# Patient Record
Sex: Female | Born: 1980 | Race: Black or African American | Hispanic: No | Marital: Single | Smoking: Current every day smoker
Health system: Southern US, Community
[De-identification: ages and names within clinical notes are randomized; demographics above are authoritative.]

## PROBLEM LIST (undated history)

## (undated) DIAGNOSIS — F32A Depression, unspecified: Secondary | ICD-10-CM

## (undated) DIAGNOSIS — I10 Essential (primary) hypertension: Secondary | ICD-10-CM

## (undated) DIAGNOSIS — F329 Major depressive disorder, single episode, unspecified: Secondary | ICD-10-CM

## (undated) HISTORY — PX: WISDOM TOOTH EXTRACTION: SHX21

## (undated) HISTORY — DX: Essential (primary) hypertension: I10

## (undated) HISTORY — DX: Major depressive disorder, single episode, unspecified: F32.9

## (undated) HISTORY — PX: DIAGNOSTIC LAPAROSCOPY: SUR761

## (undated) HISTORY — DX: Depression, unspecified: F32.A

---

## 2006-12-12 ENCOUNTER — Ambulatory Visit (HOSPITAL_COMMUNITY): Admission: RE | Admit: 2006-12-12 | Discharge: 2006-12-12 | Payer: Self-pay | Admitting: Family Medicine

## 2007-04-11 ENCOUNTER — Ambulatory Visit: Payer: Self-pay | Admitting: Family Medicine

## 2007-04-25 ENCOUNTER — Ambulatory Visit (HOSPITAL_COMMUNITY): Admission: RE | Admit: 2007-04-25 | Discharge: 2007-04-25 | Payer: Self-pay | Admitting: Family Medicine

## 2007-04-25 ENCOUNTER — Ambulatory Visit: Payer: Self-pay | Admitting: Family Medicine

## 2007-05-08 ENCOUNTER — Ambulatory Visit: Payer: Self-pay | Admitting: Obstetrics and Gynecology

## 2007-06-02 ENCOUNTER — Ambulatory Visit: Payer: Self-pay | Admitting: Obstetrics and Gynecology

## 2007-06-02 ENCOUNTER — Inpatient Hospital Stay (HOSPITAL_COMMUNITY): Admission: AD | Admit: 2007-06-02 | Discharge: 2007-06-02 | Payer: Self-pay | Admitting: Obstetrics and Gynecology

## 2008-09-23 ENCOUNTER — Emergency Department (HOSPITAL_COMMUNITY): Admission: EM | Admit: 2008-09-23 | Discharge: 2008-09-23 | Payer: Self-pay | Admitting: Family Medicine

## 2008-12-23 ENCOUNTER — Emergency Department (HOSPITAL_COMMUNITY): Admission: EM | Admit: 2008-12-23 | Discharge: 2008-12-23 | Payer: Self-pay | Admitting: Emergency Medicine

## 2008-12-27 ENCOUNTER — Emergency Department (HOSPITAL_COMMUNITY): Admission: EM | Admit: 2008-12-27 | Discharge: 2008-12-27 | Payer: Self-pay | Admitting: Emergency Medicine

## 2009-01-05 ENCOUNTER — Encounter: Admission: RE | Admit: 2009-01-05 | Discharge: 2009-04-05 | Payer: Self-pay | Admitting: Sports Medicine

## 2009-07-14 ENCOUNTER — Ambulatory Visit: Payer: Self-pay | Admitting: Obstetrics and Gynecology

## 2009-08-26 ENCOUNTER — Other Ambulatory Visit: Admission: RE | Admit: 2009-08-26 | Discharge: 2009-08-26 | Payer: Self-pay | Admitting: Obstetrics and Gynecology

## 2009-08-26 ENCOUNTER — Ambulatory Visit: Payer: Self-pay | Admitting: Obstetrics & Gynecology

## 2009-08-26 LAB — CONVERTED CEMR LAB
Prolactin: 7 ng/mL
TSH: 1.528 microintl units/mL (ref 0.350–4.500)

## 2009-09-06 ENCOUNTER — Encounter: Payer: Self-pay | Admitting: Obstetrics & Gynecology

## 2009-09-06 LAB — CONVERTED CEMR LAB: TSH: 1.526 microintl units/mL (ref 0.350–4.500)

## 2009-09-09 ENCOUNTER — Ambulatory Visit: Payer: Self-pay | Admitting: Obstetrics & Gynecology

## 2009-09-20 ENCOUNTER — Ambulatory Visit: Payer: Self-pay | Admitting: Family Medicine

## 2009-09-20 DIAGNOSIS — E282 Polycystic ovarian syndrome: Secondary | ICD-10-CM | POA: Insufficient documentation

## 2009-09-20 DIAGNOSIS — E669 Obesity, unspecified: Secondary | ICD-10-CM | POA: Insufficient documentation

## 2009-09-20 DIAGNOSIS — R7309 Other abnormal glucose: Secondary | ICD-10-CM

## 2009-09-20 DIAGNOSIS — I10 Essential (primary) hypertension: Secondary | ICD-10-CM | POA: Insufficient documentation

## 2009-10-08 ENCOUNTER — Emergency Department (HOSPITAL_COMMUNITY): Admission: EM | Admit: 2009-10-08 | Discharge: 2009-10-08 | Payer: Self-pay | Admitting: Emergency Medicine

## 2009-10-20 ENCOUNTER — Ambulatory Visit: Payer: Self-pay | Admitting: Family Medicine

## 2009-11-14 ENCOUNTER — Telehealth: Payer: Self-pay | Admitting: *Deleted

## 2009-11-28 ENCOUNTER — Telehealth: Payer: Self-pay | Admitting: Family Medicine

## 2009-12-12 ENCOUNTER — Telehealth: Payer: Self-pay | Admitting: Family Medicine

## 2009-12-12 ENCOUNTER — Ambulatory Visit (HOSPITAL_COMMUNITY): Admission: RE | Admit: 2009-12-12 | Discharge: 2009-12-12 | Payer: Self-pay | Admitting: Family Medicine

## 2010-01-08 ENCOUNTER — Emergency Department (HOSPITAL_COMMUNITY): Admission: EM | Admit: 2010-01-08 | Discharge: 2010-01-08 | Payer: Self-pay | Admitting: Family Medicine

## 2010-01-12 ENCOUNTER — Ambulatory Visit: Payer: Self-pay | Admitting: Family Medicine

## 2010-01-12 DIAGNOSIS — R109 Unspecified abdominal pain: Secondary | ICD-10-CM

## 2010-01-16 ENCOUNTER — Ambulatory Visit (HOSPITAL_COMMUNITY): Admission: RE | Admit: 2010-01-16 | Discharge: 2010-01-16 | Payer: Self-pay | Admitting: Family Medicine

## 2010-01-16 ENCOUNTER — Encounter: Payer: Self-pay | Admitting: Family Medicine

## 2010-02-07 ENCOUNTER — Ambulatory Visit: Payer: Self-pay | Admitting: Family Medicine

## 2010-04-12 ENCOUNTER — Inpatient Hospital Stay (HOSPITAL_COMMUNITY): Admission: AD | Admit: 2010-04-12 | Discharge: 2010-04-12 | Payer: Self-pay | Admitting: Obstetrics & Gynecology

## 2010-04-19 ENCOUNTER — Ambulatory Visit: Payer: Self-pay | Admitting: Obstetrics & Gynecology

## 2010-05-02 ENCOUNTER — Ambulatory Visit: Payer: Self-pay | Admitting: Family Medicine

## 2010-05-02 DIAGNOSIS — F339 Major depressive disorder, recurrent, unspecified: Secondary | ICD-10-CM | POA: Insufficient documentation

## 2010-05-17 ENCOUNTER — Telehealth: Payer: Self-pay | Admitting: *Deleted

## 2010-06-08 ENCOUNTER — Ambulatory Visit: Payer: Self-pay | Admitting: Family Medicine

## 2010-06-08 DIAGNOSIS — G4700 Insomnia, unspecified: Secondary | ICD-10-CM

## 2010-06-19 ENCOUNTER — Ambulatory Visit: Payer: Self-pay | Admitting: Family Medicine

## 2010-06-20 ENCOUNTER — Telehealth: Payer: Self-pay | Admitting: Psychology

## 2010-06-30 ENCOUNTER — Ambulatory Visit: Payer: Self-pay | Admitting: Family Medicine

## 2010-06-30 DIAGNOSIS — K029 Dental caries, unspecified: Secondary | ICD-10-CM | POA: Insufficient documentation

## 2010-07-05 IMAGING — US US TRANSVAGINAL NON-OB
1 series · 14 of 25 positions shown · non-contrast
Comparison: 10/08/2009

CLINICAL DATA: Follow-up left ovarian cyst

TRANSVAGINAL ULTRASOUND OF PELVIS
TECHNIQUE: Transvaginal ultrasound examination of the pelvis was
performed including evaluation of the uterus, ovaries, adnexal
regions, and pelvic cul-de-sac.

[Series 1: us transvaginal non-ob · 0.13mm/px · 33 acquisitions, 14 frames shown]
[im 1/33]
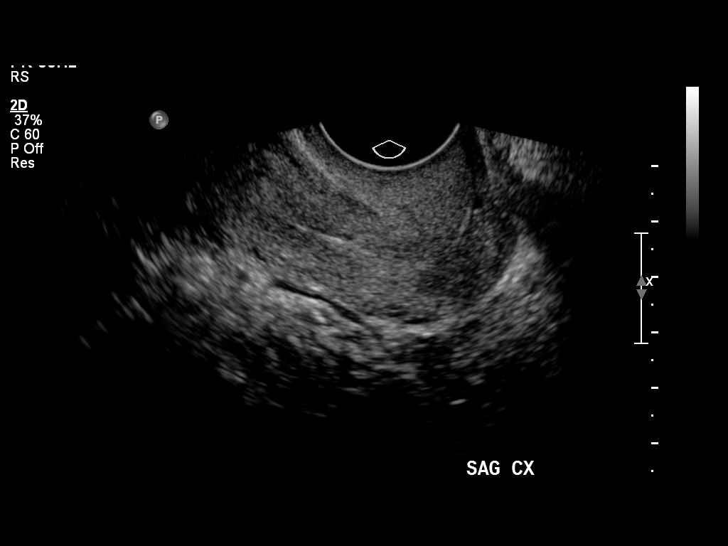
[im 3/33]
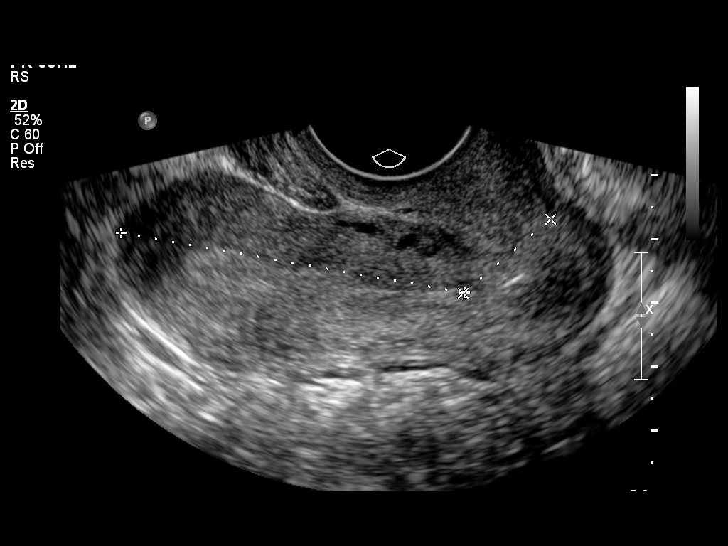
[im 6/33]
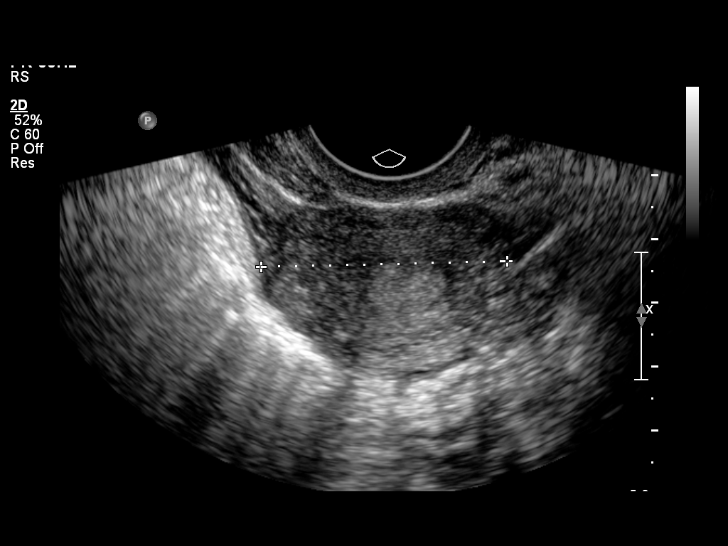
[im 9/33]
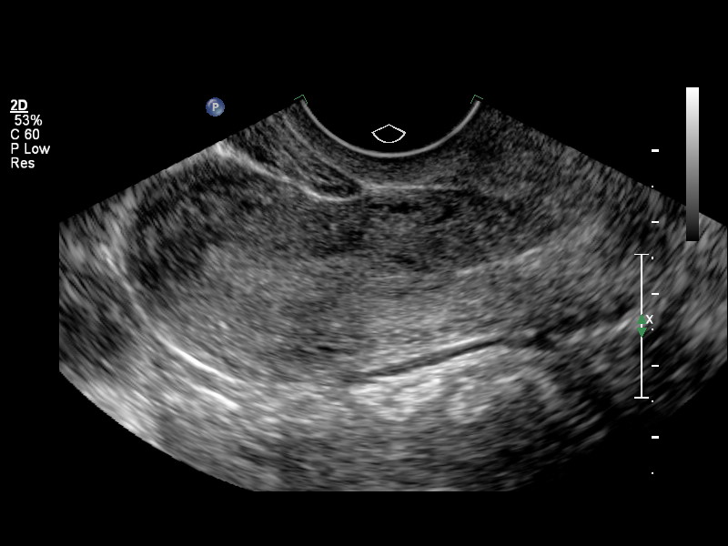
[im 11/33]
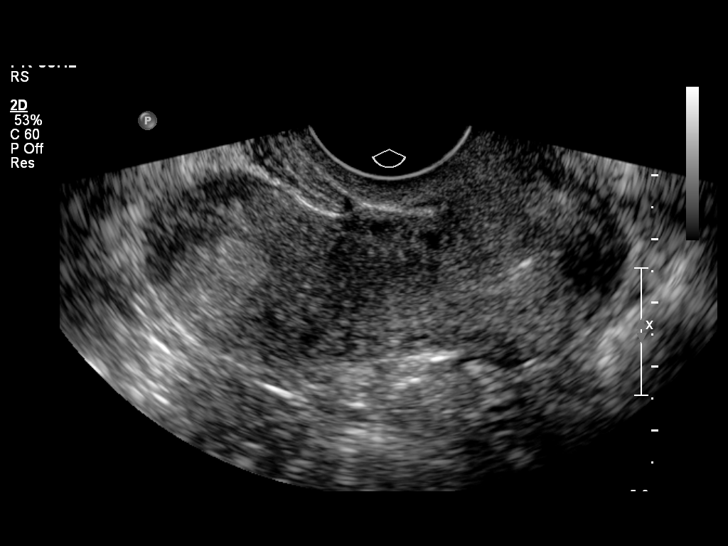
[im 13/33]
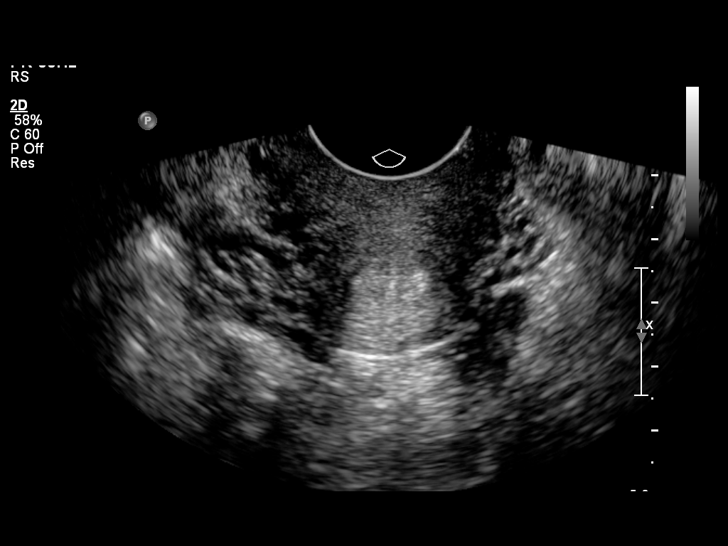
[im 15/33]
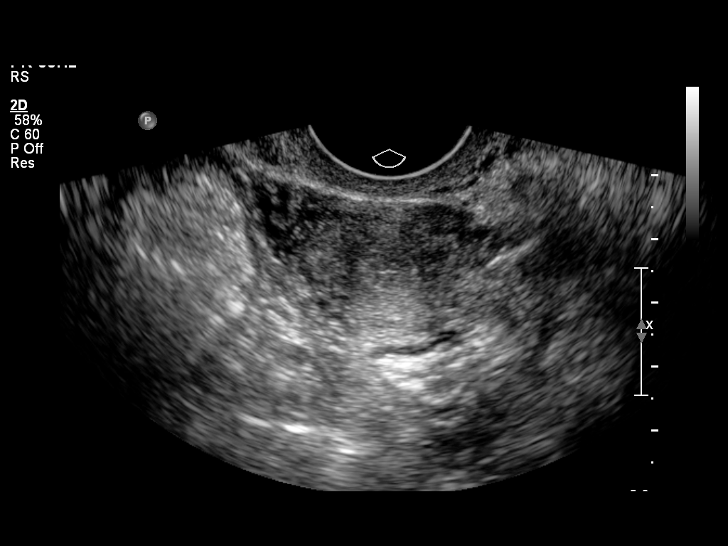
[im 18/33]
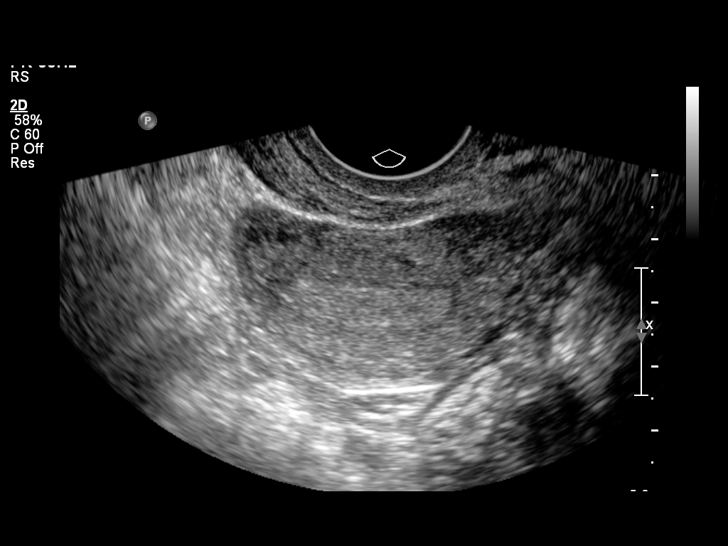
[im 21/33]
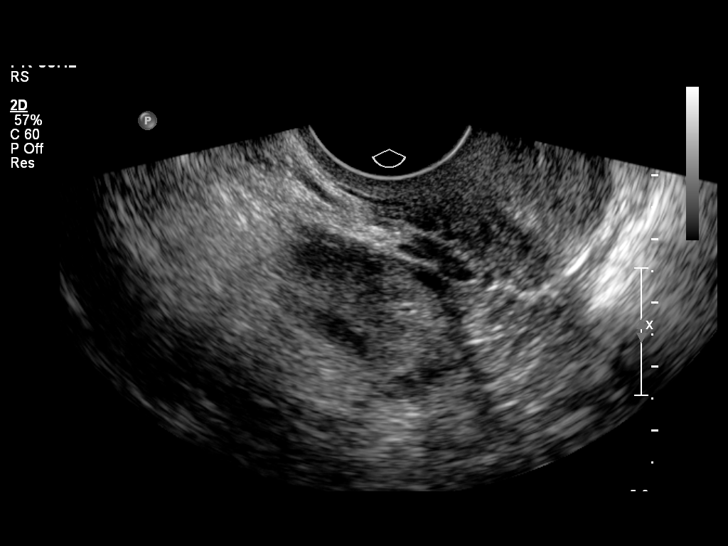
[im 22/33]
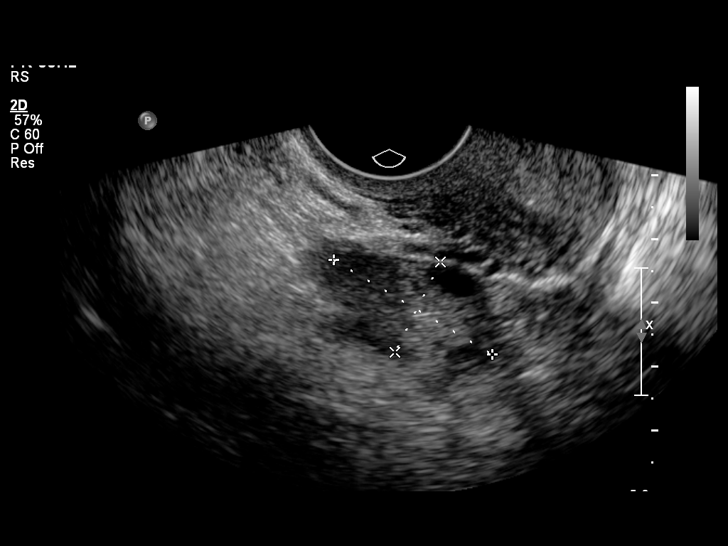
[im 25/33]
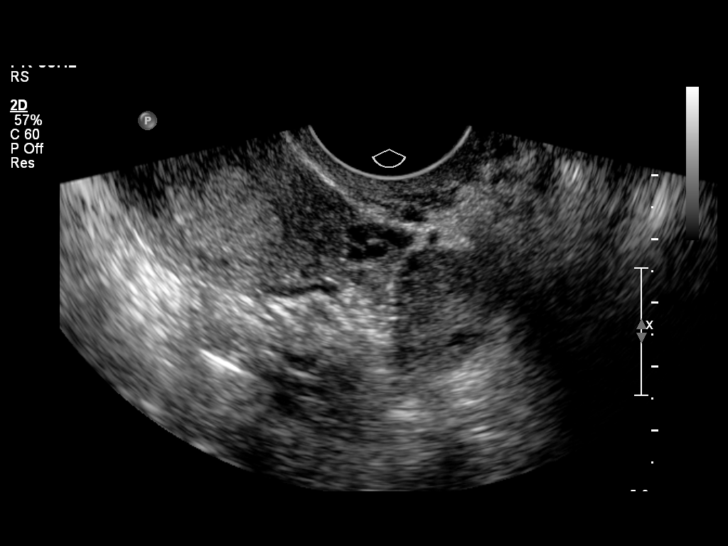
[im 27/33]
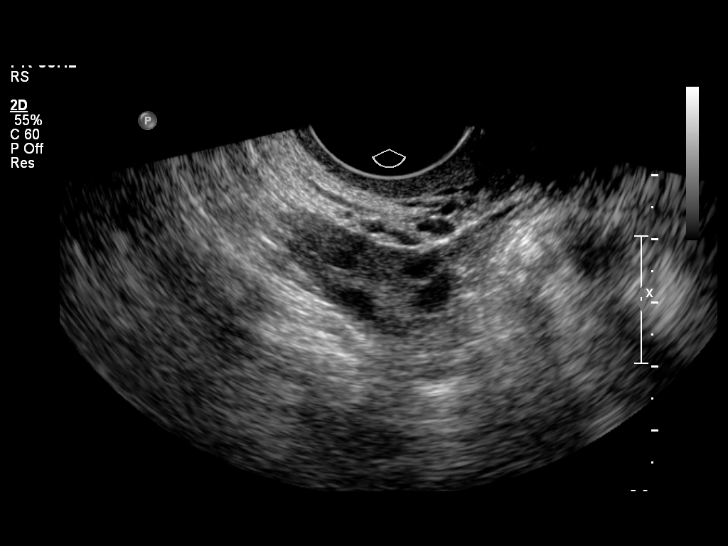
[im 30/33]
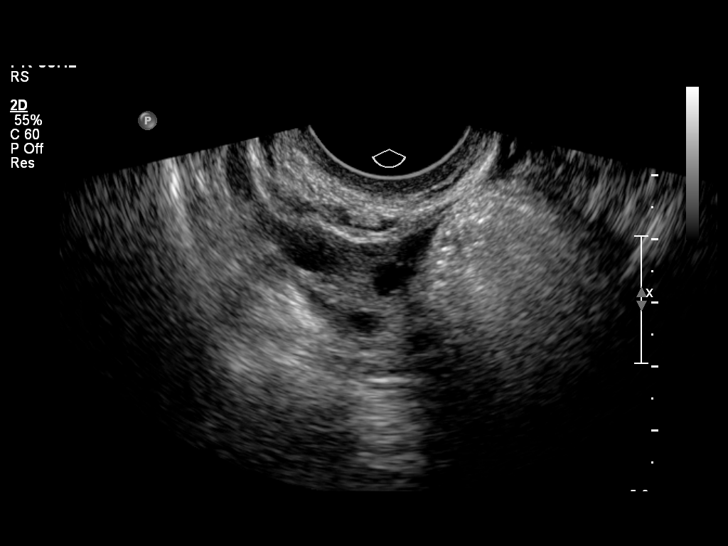
[im 33/33]
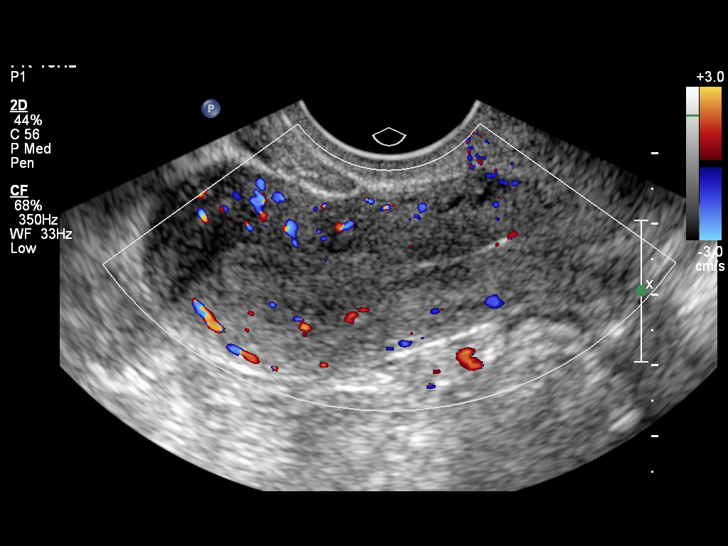

[14 of 25 positions shown; findings below may reference images not displayed]

FINDINGS: Uterus measures 7.3 x 4.3 x 2.8 cm. Anteverted, anteflexed,
unremarkable.

Endometrium measures 0.6 cm, uniformly echogenic.

Right Ovary measures 2.5 x 1.8 x 1.3 cm, normal.

Left Ovary measures 2.9 x 1.6 x 1.3 cm, normal.  The previously
seen cyst has apparently resolved.

Other Findings:  No free fluid.
IMPRESSION: Normal exam.  Interval resolution of previously seen ovarian cyst.

## 2010-07-20 ENCOUNTER — Ambulatory Visit: Payer: Self-pay | Admitting: Family Medicine

## 2010-07-20 DIAGNOSIS — F17200 Nicotine dependence, unspecified, uncomplicated: Secondary | ICD-10-CM

## 2010-07-21 ENCOUNTER — Telehealth: Payer: Self-pay | Admitting: Psychology

## 2010-07-21 ENCOUNTER — Telehealth: Payer: Self-pay | Admitting: *Deleted

## 2010-08-02 ENCOUNTER — Ambulatory Visit: Payer: Self-pay | Admitting: Psychology

## 2010-08-09 ENCOUNTER — Ambulatory Visit: Payer: Self-pay | Admitting: Family Medicine

## 2010-08-14 ENCOUNTER — Ambulatory Visit: Payer: Self-pay | Admitting: Psychology

## 2010-08-22 ENCOUNTER — Telehealth: Payer: Self-pay | Admitting: Psychology

## 2010-08-30 ENCOUNTER — Inpatient Hospital Stay (HOSPITAL_COMMUNITY): Admission: AD | Admit: 2010-08-30 | Discharge: 2010-08-30 | Payer: Self-pay | Admitting: Obstetrics & Gynecology

## 2010-08-30 ENCOUNTER — Ambulatory Visit: Payer: Self-pay | Admitting: Gynecology

## 2010-09-04 ENCOUNTER — Ambulatory Visit: Payer: Self-pay | Admitting: Psychology

## 2010-09-08 ENCOUNTER — Ambulatory Visit: Payer: Self-pay | Admitting: Family Medicine

## 2010-09-08 DIAGNOSIS — N912 Amenorrhea, unspecified: Secondary | ICD-10-CM

## 2010-09-11 ENCOUNTER — Ambulatory Visit: Payer: Self-pay | Admitting: Psychology

## 2010-09-27 ENCOUNTER — Ambulatory Visit: Payer: Self-pay | Admitting: Psychology

## 2010-10-06 ENCOUNTER — Telehealth (INDEPENDENT_AMBULATORY_CARE_PROVIDER_SITE_OTHER): Payer: Self-pay | Admitting: *Deleted

## 2010-10-13 ENCOUNTER — Ambulatory Visit: Payer: Self-pay | Admitting: Family Medicine

## 2010-10-13 LAB — CONVERTED CEMR LAB
Platelets: 333 10*3/uL (ref 150–400)
RDW: 14.5 % (ref 11.5–15.5)

## 2010-10-18 ENCOUNTER — Telehealth: Payer: Self-pay | Admitting: Psychology

## 2010-10-24 ENCOUNTER — Encounter: Payer: Self-pay | Admitting: Family Medicine

## 2010-10-31 ENCOUNTER — Ambulatory Visit: Payer: Self-pay | Admitting: Psychology

## 2010-11-03 IMAGING — US US TRANSVAGINAL NON-OB
1 series · 14 of 25 positions shown · non-contrast
Comparison: None.

CLINICAL DATA: Abnormal uterine bleeding.  Abdominal and pelvic
pain.

TRANSABDOMINAL AND TRANSVAGINAL ULTRASOUND OF PELVIS
TECHNIQUE: Both transabdominal and transvaginal ultrasound
examinations of the pelvis were performed including evaluation of
the uterus, ovaries, adnexal regions, and pelvic cul-de-sac.

[Series 1: us transvaginal non-ob · 0.12mm/px · 14 of 28 slices shown]
[im 1/28]
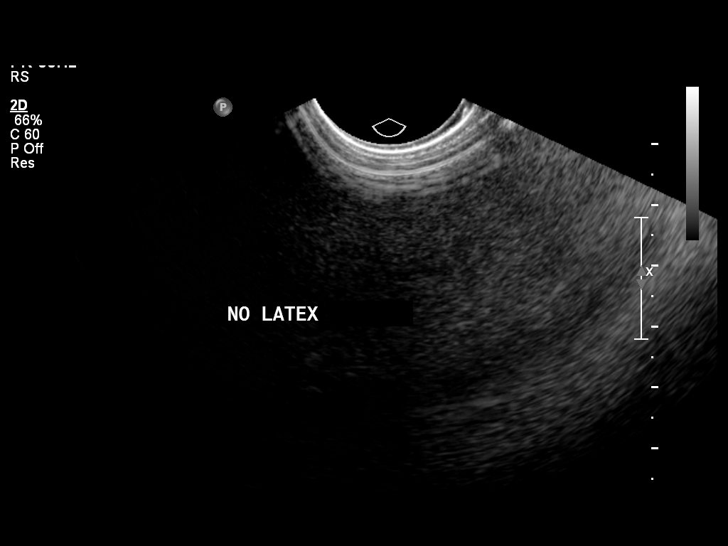
[im 3/28]
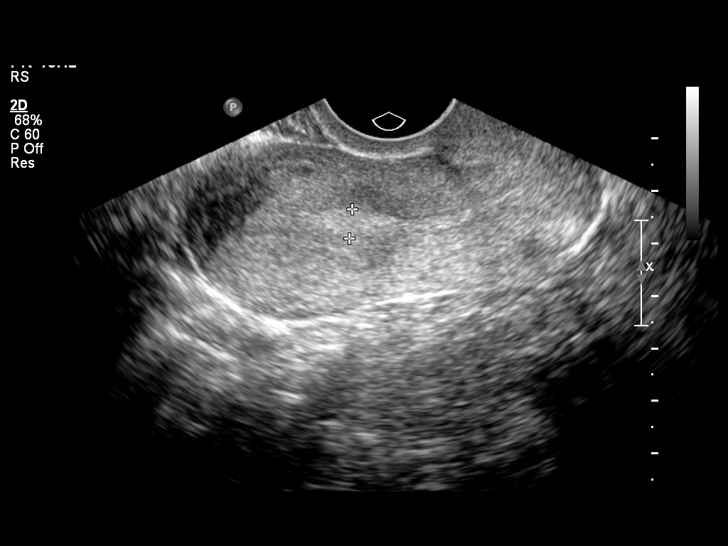
[im 5/28]
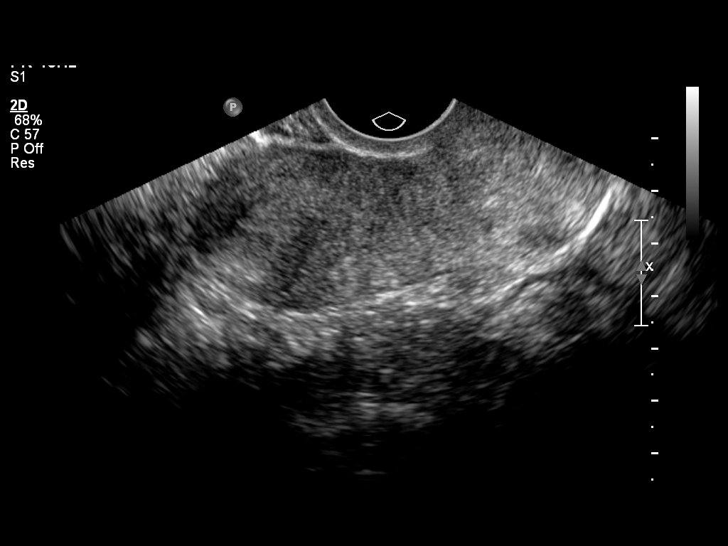
[im 7/28]
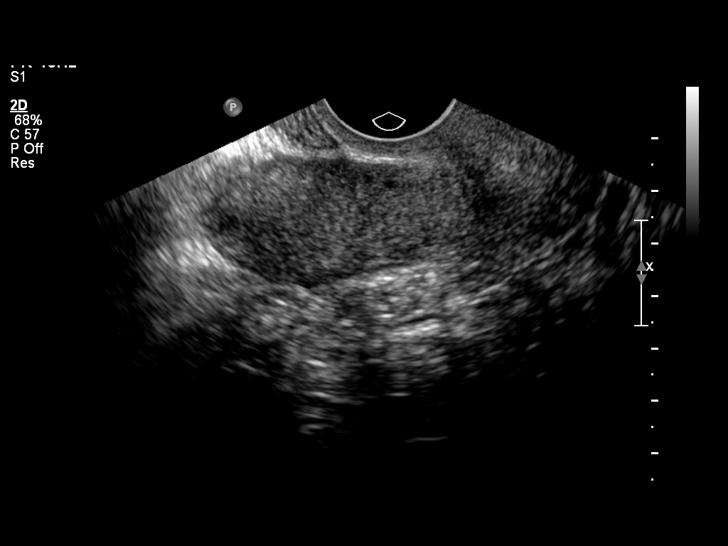
[im 10/28]
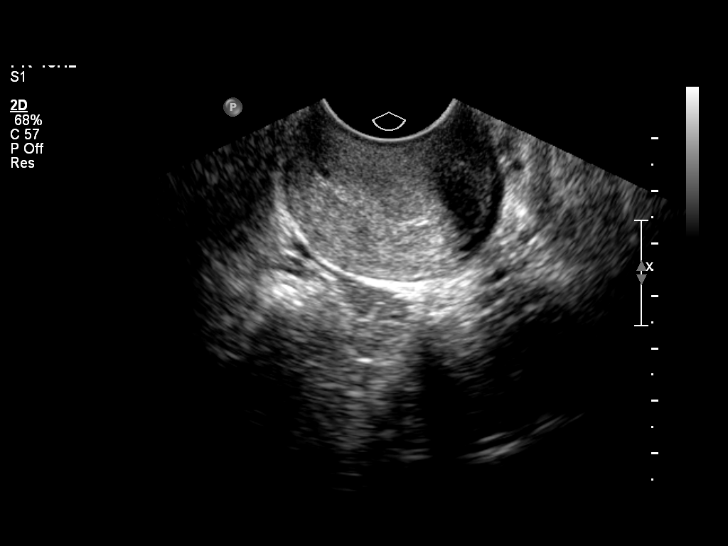
[im 11/28]
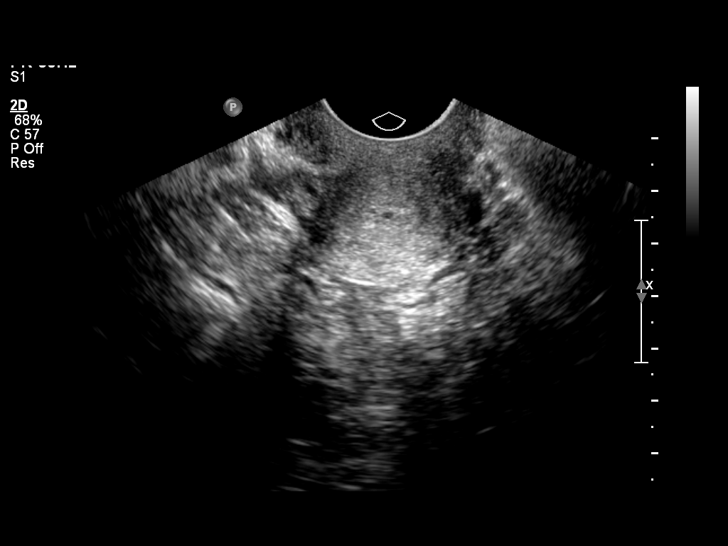
[im 13/28]
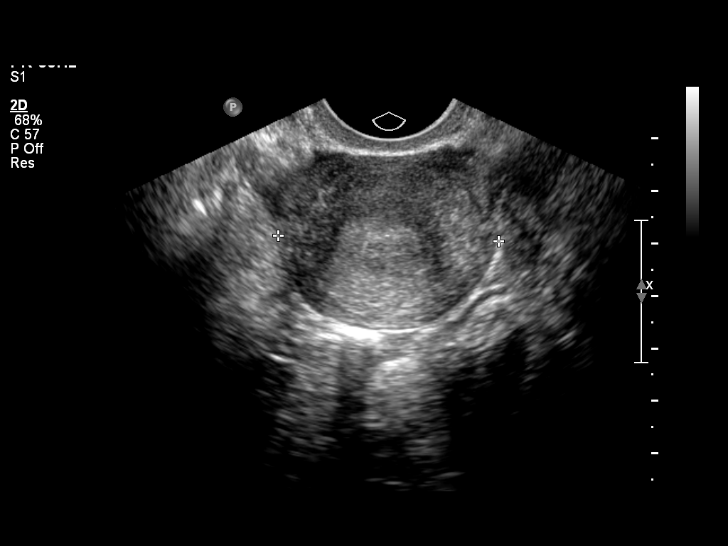
[im 15/28]
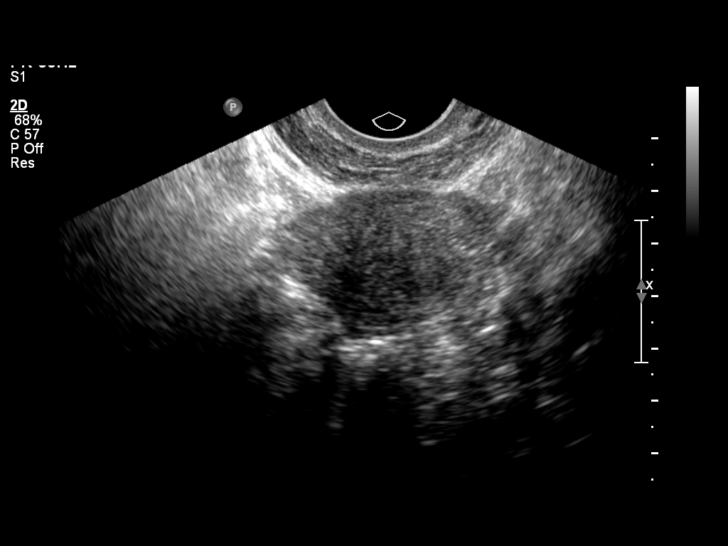
[im 17/28]
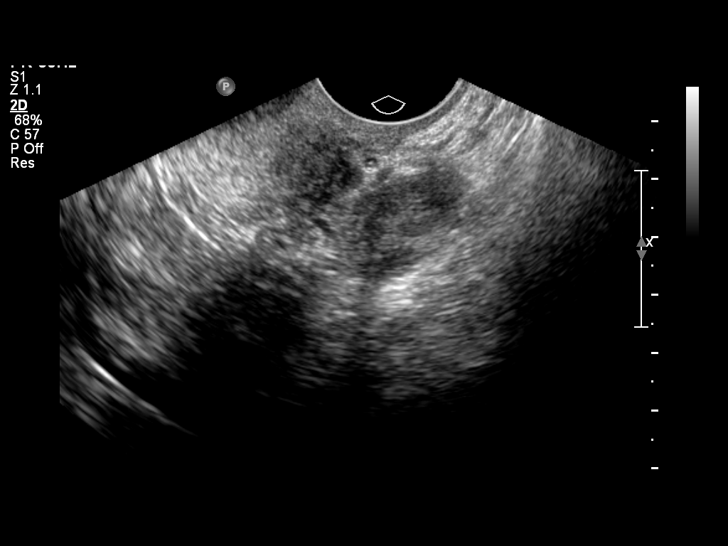
[im 19/28]
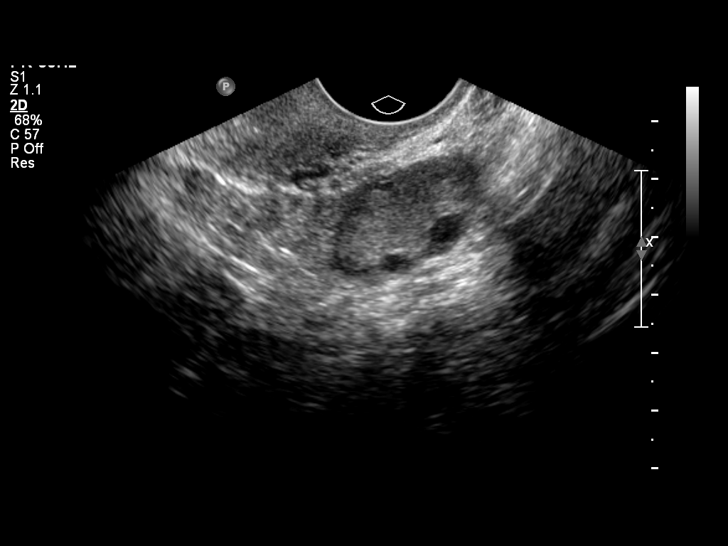
[im 21/28]
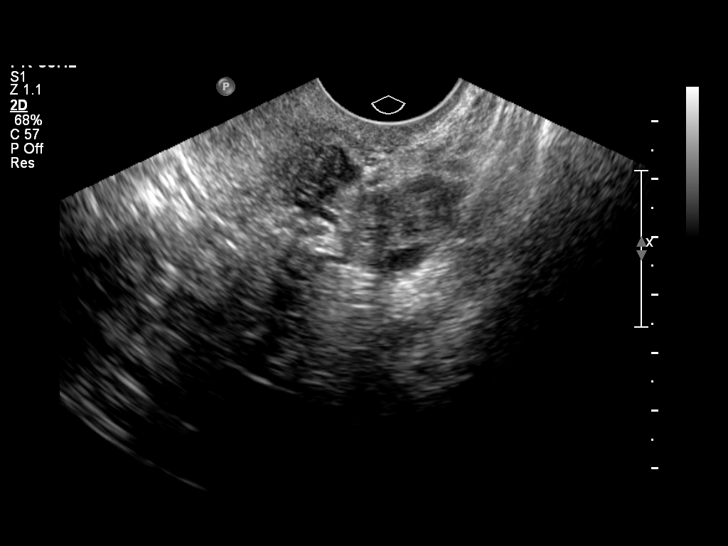
[im 23/28]
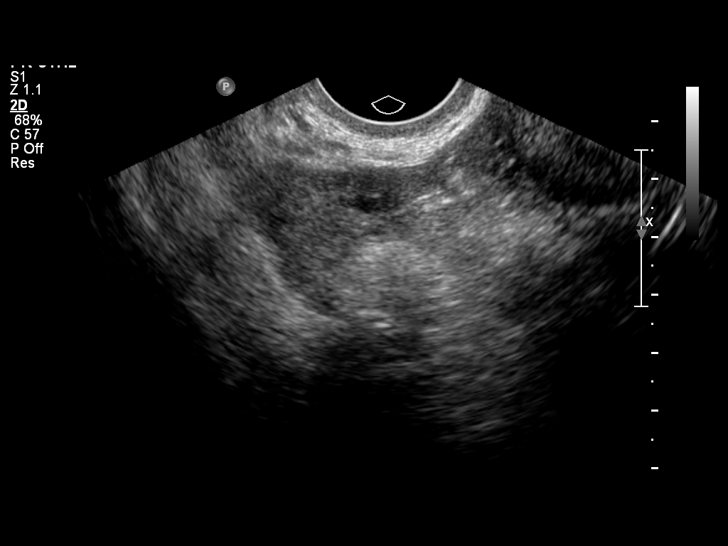
[im 25/28]
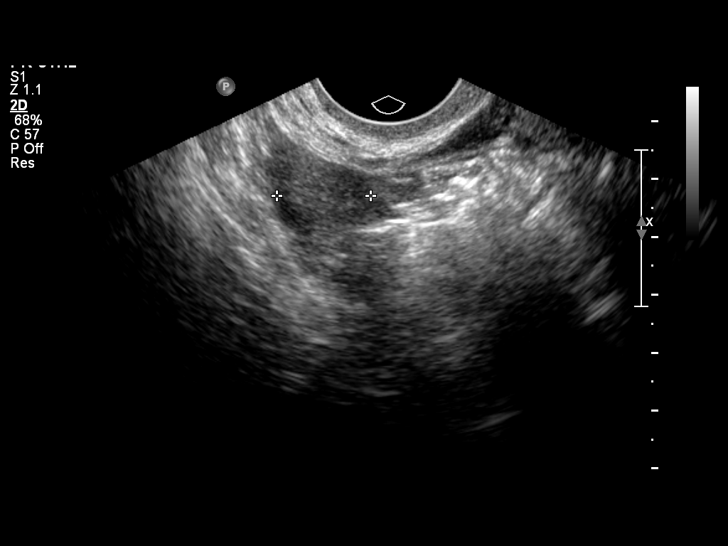
[im 28/28]
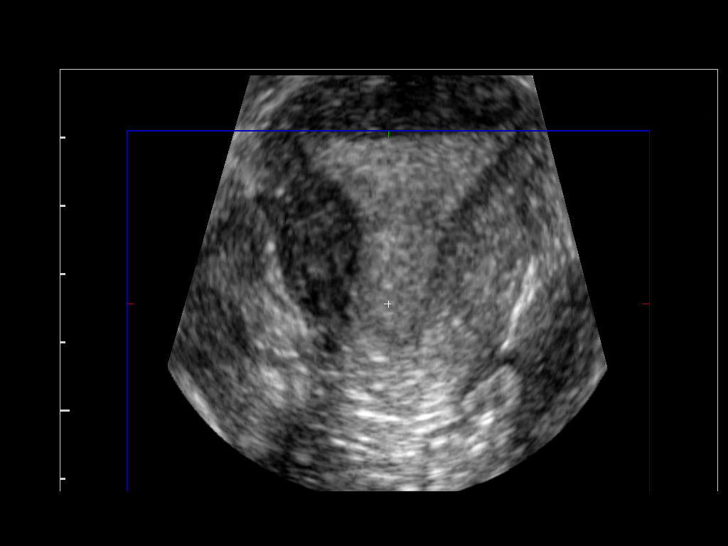

[14 of 25 positions shown; findings below may reference images not displayed]

FINDINGS: Uterus the uterus is normal measuring 7.8 x 3.5 x 4.2 cm.

Endometrium appears normal and is 5.6 mm in thickness.

Right Ovary normal.  3.3 x 1.5 x 1.6 cm.

Left Ovary normal.  3.0 x 1.4 x 1.3 cm.

Other Findings:  There is no adnexal mass or free fluid.
IMPRESSION: Normal pelvic ultrasound.  Specifically, normal appearing uterus
and ovaries.

## 2010-11-06 ENCOUNTER — Ambulatory Visit: Payer: Self-pay | Admitting: Family Medicine

## 2010-11-06 ENCOUNTER — Telehealth: Payer: Self-pay | Admitting: Family Medicine

## 2010-11-20 ENCOUNTER — Ambulatory Visit: Payer: Self-pay | Admitting: Family Medicine

## 2010-11-27 ENCOUNTER — Telehealth: Payer: Self-pay | Admitting: Family Medicine

## 2010-11-28 ENCOUNTER — Ambulatory Visit: Payer: Self-pay | Admitting: Family Medicine

## 2010-12-19 ENCOUNTER — Ambulatory Visit: Payer: Self-pay | Admitting: Family Medicine

## 2010-12-20 ENCOUNTER — Telehealth (INDEPENDENT_AMBULATORY_CARE_PROVIDER_SITE_OTHER): Payer: Self-pay | Admitting: *Deleted

## 2010-12-29 ENCOUNTER — Telehealth: Payer: Self-pay | Admitting: Family Medicine

## 2011-01-18 ENCOUNTER — Ambulatory Visit
Admission: RE | Admit: 2011-01-18 | Discharge: 2011-01-18 | Payer: Self-pay | Source: Home / Self Care | Attending: Family Medicine | Admitting: Family Medicine

## 2011-01-23 NOTE — Assessment & Plan Note (Signed)
Summary: F/U VISIT/[facility]   Vital Signs:  Patient profile:   30 year old female Height:      63 inches Weight:      193 pounds BMI:     34.31 BSA:     1.91 Temp:     98.1 degrees F Pulse rate:   84 / minute BP sitting:   122 / 80  Vitals Entered By: Jone Baseman CMA (August 09, 2010 9:32 AM) CC: f/u paroxetine, Depressive symptoms Is Patient Diabetic? No Pain Assessment Patient in pain? no        CC:  f/u paroxetine and Depressive symptoms.  History of Present Illness:  Depressive symptoms      This is a right-handed patient who presents with Depressive symptoms.  The patient reports depressed mood, loss of interest/pleasure, and insomnia.  The patient also reports fatigue or loss of energy and diminished concentration.  The patient denies thoughts of death and thoughts of suicide.  The patient reports the following psychosocial stressors: recent death of a loved one.  The patient denies abnormally elevated mood, abnormally irritable mood, decreased need for sleep, increased talkativeness, distractibility, flight of ideas, increased goal-directed activity, and inflated self-esteem/ grandiosity.    Hypertension follow-up      The patient also presents for Hypertension follow-up.  The patient denies lightheadedness, headaches, and fatigue.  Compliance with medications (by patient report) has been near 100%.  The patient reports that dietary compliance has been fair.  The patient reports no exercise.    Habits & Providers  Alcohol-Tobacco-Diet     Tobacco Status: never     Tobacco Counseling: to quit use of tobacco products     Other Tobacco cigar     Other per week 7  Current Problems (verified): 1)  Tobacco Abuse  (ICD-305.1) 2)  Dental Caries  (ICD-521.00) 3)  Insomnia-sleep Disorder-unspec  (ICD-780.52) 4)  Depression  (ICD-311) 5)  Pelvic Pain, Left  (ICD-789.09) 6)  Low Back Pain  (ICD-724.2) 7)  Impaired Glucose Tolerance  (ICD-790.22) 8)  Family History Diabetes  1st Degree Relative  (ICD-V18.0) 9)  Obesity  (ICD-278.00) 10)  Polycystic Ovary Syndrome  (ICD-256.4) 11)  Hypertension  (ICD-401.1)  Current Medications (verified): 1)  Hydrochlorothiazide 25 Mg Tabs (Hydrochlorothiazide) .... Take One (1) By Mouth Daily 2)  Paxil 40 Mg Tabs (Paroxetine Hcl) .Marland Kitchen.. 1 By Mouth Daily 3)  Ambien 5 Mg Tabs (Zolpidem Tartrate) .Marland Kitchen.. 1-2 By Mouth Q Hs As Needed Insomnia 4)  Trazodone Hcl 100 Mg Tabs (Trazodone Hcl) .Marland Kitchen.. 1 By Mouth At Bedtime As Needed Insomnia.  Do Not Take With Ambien 5)  Cyclobenzaprine Hcl 10 Mg Tabs (Cyclobenzaprine Hcl) .... Take 1 At Night For Muscle Spasm.  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    Physical Exam  General:  alert, well-developed, and well-nourished.   Neck:  supple.  Lungs:  normal respiratory effort.   Heart:  normal rate.   Abdomen:  soft and non-tender.   Psych:  Oriented X3, memory intact for recent and remote, normally interactive, good eye contact, not agitated, not suicidal, and depressed affect.     Impression & Recommendations:  Problem # 1:  DEPRESSION (ICD-311)  Her updated medication list for this problem includes:    Paxil 40 Mg  Tabs (Paroxetine hcl) .Marland Kitchen... 1 by mouth daily    Trazodone Hcl 100 Mg Tabs (Trazodone hcl) .Marland Kitchen... 1 by mouth at bedtime as needed insomnia.  do not take with ambien  Orders: [facility]- Est Level  3 ([address])  Problem # 2:  INSOMNIA-SLEEP DISORDER-UNSPEC (ICD-780.52)  Her updated medication list for this problem includes:    Ambien 5 Mg Tabs (Zolpidem tartrate) .Marland Kitchen... 1-2 by mouth q hs as needed insomnia  Orders: [facility]- Est Level  3 ([address]130)  Problem # 3:  HYPERTENSION (ICD-401.1)  Her updated medication list for this problem includes:    Hydrochlorothiazide 25 Mg Tabs (Hydrochlorothiazide) .Marland Kitchen... Take one (1) by mouth daily  Orders: [facility]- Est Level  3 (86578)  Complete Medication List: 1)  Hydrochlorothiazide 25 Mg Tabs (Hydrochlorothiazide) .... Take one (1) by mouth daily 2)  Paxil 40 Mg Tabs (Paroxetine hcl) .Marland Kitchen.. 1 by mouth daily 3)  Ambien 5 Mg Tabs (Zolpidem tartrate) .Marland Kitchen.. 1-2 by mouth q hs as needed insomnia 4)  Trazodone Hcl 100 Mg Tabs (Trazodone hcl) .Marland Kitchen.. 1 by mouth at bedtime as needed insomnia.  do not take with ambien 5)  Cyclobenzaprine Hcl 10 Mg Tabs (Cyclobenzaprine hcl) .... Take 1 at night for muscle spasm.  may make you sleepy  Patient Instructions: 1)  Please schedule a follow-up appointment in 6 weeks. Prescriptions: PAXIL 40 MG TABS (PAROXETINE HCL) 1 by mouth daily  #30 x 3   Entered and Authorized by:   Tinnie Gens MD   Signed by:   Tinnie Gens MD on 08/09/2010   Method used:   Electronically to        [facility]  [phone]* (retail)       [address]       [place], [place]  [address]       Ph: [phone] or [phone]       Fax: [phone]   RxID:   [id number] HYDROCHLOROTHIAZIDE 25 MG TABS (HYDROCHLOROTHIAZIDE) Take one (1) by mouth daily  #30 x 5   Entered and Authorized by:   Tinnie Gens MD   Signed by:   Tinnie Gens MD on 08/09/2010   Method used:   Electronically to        [facility]  [phone]*  (retail)       [address]       [place], [place]  [address]       Ph: [phone] or [phone]       Fax: [phone]   RxID:   [id number] HYDROCHLOROTHIAZIDE 25 MG TABS (HYDROCHLOROTHIAZIDE) Take one (1) by mouth daily  #30 x 5   Entered and Authorized by:   Tinnie Gens MD   Signed by:   Tinnie Gens MD on 08/09/2010   Method used:   Print then Give to Patient   RxID:   [id number] PAXIL 40 MG TABS (PAROXETINE HCL) 1 by mouth daily  #30 x 3   Entered and Authorized by:   Kenney Houseman  Shawnie Pons MD   Signed by:   Tinnie Gens MD on 08/09/2010   Method used:   Print then Give to Patient   RxID:   [id number]

## 2011-01-23 NOTE — Letter (Signed)
Summary: Results Follow-up Letter  All     ,     Phone:   Fax:     01/16/2010  [address] [place], [place]  [address]  Dear Ms. Hyman Hopes,   The following are the results of your recent test(s):   CT of the abdomen and pelvis is normal.   Sincerely,    Tinnie Gens MD

## 2011-01-23 NOTE — Progress Notes (Signed)
Summary: phn msg  Phone Note Call from Patient Call back at Home Phone [phone]   Caller: Patient Summary of Call: had IUD last week and is still cramping- wants to talk with nurse Initial call taken by: De Nurse,  November 27, 2010 12:28 PM  Follow-up for Phone Call        Spoke with patient and she is bleeding like a regular menstrual cycle also having some pretty bad cramping when she sits straight.  Made appt with Dr.Carew for tomorrow afternoon. Follow-up by: Jone Baseman CMA,  November 27, 2010 4:31 PM

## 2011-01-23 NOTE — Progress Notes (Signed)
Summary: Ref Req  Phone Note Call from Patient Call back at Home Phone [phone]   Caller: Patient Summary of Call: Pt would like to get a referral to the dental clinic.  Has the orange card. Initial call taken by: Clydell Hakim,  July 21, 2010 1:53 PM  Follow-up for Phone Call        Spoke withpatient, she has been having dental pain and would like to have this evaluated. Informed patient that we would send in referral but it could take weeks up to months before she is seen. Patient expressed understanding. Referral faxed to [facility] Follow-up by: Garen Grams LPN,  July 24, 2010 10:43 AM

## 2011-01-23 NOTE — Assessment & Plan Note (Signed)
Summary: f/u eo   Vital Signs:  Patient profile:   30 year old female Height:      63 inches Weight:      194 pounds BMI:     34.49 BSA:     1.91 Temp:     98.1 degrees F Pulse rate:   69 / minute BP sitting:   121 / 82  Vitals Entered By: Jone Baseman CMA (June 19, 2010 10:20 AM) CC: f/u med change, Depressive symptoms Is Patient Diabetic? No Pain Assessment Patient in pain? no        CC:  f/u med change and Depressive symptoms.  History of Present Illness:  Depressive symptoms      This is a 30 year old woman who presents with Depressive symptoms.  The patient reports depressed mood, loss of interest/pleasure, significant weight loss, insomnia, and psychomotor retardation.  The patient also reports fatigue or loss of energy.  The patient denies thoughts of suicide, suicidal intent, and suicidal plans.  Recent change in meds has not helped.  Trazadone did not help with sleep.  The patient reports the following psychosocial stressors: major life changes.  The patient denies abnormally elevated mood, abnormally irritable mood, increased talkativeness, distractibility, and flight of ideas.    Habits & Providers  Alcohol-Tobacco-Diet     Tobacco Status: never  Current Problems (verified): 1)  Insomnia-sleep Disorder-unspec  (ICD-780.52) 2)  Depression  (ICD-311) 3)  Pelvic Pain, Left  (ICD-789.09) 4)  Low Back Pain  (ICD-724.2) 5)  Impaired Glucose Tolerance  (ICD-790.22) 6)  Family History Diabetes 1st Degree Relative  (ICD-V18.0) 7)  Obesity  (ICD-278.00) 8)  Polycystic Ovary Syndrome  (ICD-256.4) 9)  Hypertension  (ICD-401.1)  Current Medications (verified): 1)  Hydrochlorothiazide 25 Mg Tabs (Hydrochlorothiazide) .... Take One (1) By Mouth Daily 2)  Diclofenac Sodium 75 Mg Tbec (Diclofenac Sodium) .Marland Kitchen.. 1 By Mouth Two Times A Day As Needed 3)  Oxycodone-Acetaminophen 5-500 Mg Caps (Oxycodone-Acetaminophen) .Marland Kitchen.. 1 By Mouth Q 4-6 Hrs As Needed 4)  Camila 0.35 Mg  Tabs (Norethindrone) .Marland Kitchen.. 1 By Mouth Daily 5)  Paroxetine Hcl 20 Mg Tabs (Paroxetine Hcl) .Marland Kitchen.. 1 By Mouth Daily 6)  Ambien 5 Mg Tabs (Zolpidem Tartrate) .Marland Kitchen.. 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  The patient denies anorexia, chest pain, syncope, dyspnea on exertion, peripheral edema, prolonged cough, headaches, abdominal pain, and severe indigestion/heartburn.    Physical Exam  General:  alert, well-developed, and well-nourished.   Psych:  Oriented X3, not agitated, not suicidal, not homicidal, depressed affect, flat affect, subdued, and tearful.     Impression & Recommendations:  Problem # 1:  INSOMNIA-SLEEP DISORDER-UNSPEC (ICD-780.52)  Orders: [facility]- Est Level  3 ([address])  Her updated medication list for this problem includes:    Ambien 5 Mg Tabs (Zolpidem tartrate) .Marland Kitchen... 1-2 by mouth q hs as needed insomnia  Problem # 2:  DEPRESSION (ICD-311) To make appt. with Dr. Jeanette Caprice importance of this---contracts for safety. The following medications were removed from the medication list:    Trazodone Hcl 100 Mg Tabs (Trazodone hcl) .Marland Kitchen... 1 by mouth at night as needed insomnia Her updated medication list for this problem includes:    Paroxetine Hcl 20 Mg Tabs (Paroxetine hcl) .Marland Kitchen... 1 by mouth  daily  [facility]ders[facility]- Est Level  3 ([address])  Complete Medication List: 1)  Hydrochlorothiazide 25 Mg Tabs (Hydrochlorothiazide) .... Take one (1) by mouth daily 2)  Diclofenac Sodium 75 Mg Tbec (Diclofenac sodium) .Marland Kitchen.. 1 by mouth two times a day as needed 3)  Oxycodone-acetaminophen 5-500 Mg Caps (Oxycodone-acetaminophen) .Marland Kitchen.. 1 by mouth q 4-6 hrs as needed 4)  Camila 0.35 Mg Tabs (Norethindrone) .Marland Kitchen.. 1 by mouth daily 5)  Paroxetine Hcl 20 Mg Tabs (Paroxetine hcl) .Marland Kitchen.. 1 by mouth daily 6)  Ambien 5 Mg Tabs (Zolpidem tartrate) .Marland Kitchen.. 1-2 by mouth q hs as needed insomnia  Patient Instructions: 1)  Please schedule a follow-up appointment in 2 weeks.  Prescriptions: AMBIEN 5 MG TABS (ZOLPIDEM TARTRATE) 1-2 by mouth q hs as needed insomnia  #15 x 2   Entered and Authorized by:   Tinnie Gens MD   Signed by:   Tinnie Gens MD on 06/19/2010   Method used:   Print then Give to Patient   RxID:   [id number]

## 2011-01-23 NOTE — Assessment & Plan Note (Signed)
Summary: VAGINAL BLEED X 15 DAYS/[facility]   Vital Signs:  Patient profile:   30 year old female Height:      63 inches Weight:      194 pounds BMI:     34.49 BSA:     1.91 Temp:     97.4 degrees F Pulse rate:   93 / minute BP sitting:   147 / 78  Vitals Entered By: Jone Baseman CMA (September 08, 2010 8:42 AM) CC: [facility] f/u Is Patient Diabetic? No Pain Assessment Patient in pain? no        CC:  [facility] f/u.  History of Present Illness:       This is a 30 year old female who presents with menstrual disorder.  The patient complains of heavy bleeding.  Menstrual periods have been irregular.  Goes months without a cycle and then has bleeding for many days that is heavy.  She was seen at [facility] and given Megace which has now stopped bleeding.  Using condoms for birth control.    Habits & Providers  Alcohol-Tobacco-Diet     Tobacco Status: current     Tobacco Counseling: to quit use of tobacco products     Cigarette Packs/Day: cigars 1/day  Current Problems (verified): 1)  Tobacco Abuse  (ICD-305.1) 2)  Dental Caries  (ICD-521.00) 3)  Insomnia-sleep Disorder-unspec  (ICD-780.52) 4)  Depression  (ICD-311) 5)  Pelvic Pain, Left  (ICD-789.09) 6)  Low Back Pain  (ICD-724.2) 7)  Impaired Glucose Tolerance  (ICD-790.22) 8)  Family History Diabetes 1st Degree Relative  (ICD-V18.0) 9)  Obesity  (ICD-278.00) 10)  Polycystic Ovary Syndrome  (ICD-256.4) 11)  Hypertension  (ICD-401.1)  Current Medications (verified): 1)  Hydrochlorothiazide 25 Mg Tabs (Hydrochlorothiazide) .... Take One (1) By Mouth Daily 2)  Paxil 20 Mg Tabs (Paroxetine Hcl) .Marland Kitchen.. 1 By Mouth Two Times A Day 3)  Ambien 5 Mg Tabs (Zolpidem Tartrate) .Marland Kitchen.. 1-2 By Mouth Q Hs As Needed Insomnia 4)  Trazodone Hcl 100 Mg Tabs (Trazodone Hcl) .Marland Kitchen.. 1 By Mouth At Bedtime As Needed Insomnia.  Do Not Take With Ambien 5)  Cyclobenzaprine Hcl 10 Mg Tabs (Cyclobenzaprine Hcl) .... Take 1 At Night For Muscle Spasm.  May Make You  Sleepy 6)  Provera 5 Mg Tabs (Medroxyprogesterone Acetate) .Marland Kitchen.. 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    Physical Exam  General:  alert, well-developed, and well-nourished.   Neck:  supple.   Lungs:  normal respiratory effort.   Heart:  normal rate.   Abdomen:  soft, non-tender, and normal bowel sounds.     Impression & Recommendations:  Problem # 1:  DEPRESSION (ICD-311)  Her updated medication list for this problem includes:    Paxil 20 Mg Tabs (Paroxetine hcl) .Marland Kitchen... 1 by mouth two times a  day    Trazodone Hcl 100 Mg Tabs (Trazodone hcl) .Marland Kitchen... 1 by mouth at bedtime as needed insomnia.  do not take with ambien  Orders: [facility]- Est Level  3 ([address])  Problem # 2:  MENORRHAGIA (ICD-626.2)  Her updated medication list for this problem includes:    Provera 5 Mg Tabs (Medroxyprogesterone acetate) .Marland Kitchen... 1 by mouth daily x 5 days on the 15th of odd months Given cycle every other month to ensure no prolonged periods of amenorrhea, followed by menorrhagia. Orders: [facility]- Est Level  3 (62130)  Problem # 3:  AMENORRHEA (ICD-626.0)  Her updated medication list for this  problem includes:    Provera 5 Mg Tabs (Medroxyprogesterone acetate) .Marland Kitchen... 1 by mouth daily x 5 days on the 15th of odd months  Orders: [facility]- Est Level  3 ([address])  Complete Medication List: 1)  Hydrochlorothiazide 25 Mg Tabs (Hydrochlorothiazide) .... Take one (1) by mouth daily 2)  Paxil 20 Mg Tabs (Paroxetine hcl) .Marland Kitchen.. 1 by mouth two times a day 3)  Ambien 5 Mg Tabs (Zolpidem tartrate) .Marland Kitchen.. 1-2 by mouth q hs as needed insomnia 4)  Trazodone Hcl 100 Mg Tabs (Trazodone hcl) .Marland Kitchen.. 1 by mouth at bedtime as needed insomnia.  do not take with ambien 5)  Cyclobenzaprine Hcl 10 Mg Tabs (Cyclobenzaprine hcl) .... Take 1 at night for muscle spasm.  may make you sleepy 6)  Provera 5 Mg Tabs (Medroxyprogesterone acetate) .Marland Kitchen.. 1 by mouth daily x 5 days on the 15th of odd months  Patient Instructions: 1)  Please schedule a follow-up appointment in 6-8 weeks.  Prescriptions: PROVERA 5 MG TABS (MEDROXYPROGESTERONE ACETATE) 1 by mouth daily x 5 days on the 15th of odd months  #30 x 2   Entered and Authorized by:   Tinnie Gens MD   Signed by:   Tinnie Gens MD on 09/08/2010   Method used:   Electronically to        [facility]  [phone]* (retail)       [address]       [place], [place]  [address]       Ph: [phone] or [phone]       Fax: [phone]   RxID:   [id number] PAXIL 20 MG TABS (PAROXETINE HCL) 1 by mouth two times a day  #60 x 3   Entered and Authorized by:   Tinnie Gens MD   Signed by:   Tinnie Gens MD on 09/08/2010   Method used:   Electronically to        [facility]  [phone]* (retail)       [address]       [place], [place]  [address]       Ph: [phone] or [phone]       Fax: [phone]   RxID:   [id number]

## 2011-01-23 NOTE — Letter (Signed)
Summary: Results Follow-up Letter  All     ,     Phone:   Fax:     10/24/2010  [address] [place], [place]  [address]  Dear Ms. Hyman Hopes,   The following are the results of your recent test(s):  You are only mildly anemic.  In fact iron level is fairly normal for a menstruating female.  Sincerely,    Tinnie Gens MD

## 2011-01-23 NOTE — Assessment & Plan Note (Signed)
Summary: iud insertion/eo   Vital Signs:  Patient profile:   30 year old female Height:      63 inches Weight:      208.44 pounds Temp:     99.4 degrees F BP sitting:   122 / 90  Vitals Entered By: Jone Baseman CMA (November 20, 2010 1:55 PM) CC: IUD insert Is Patient Diabetic? No Pain Assessment Patient in pain? no        CC:  IUD insert.  History of Present Illness: Here for IUD insertion.  Second attempt.  took cytotec last pm.  Still no cycle with provera.  No unprotected intercourse x 2 wks.  2nd negative pregnancy test.  Ok to put IUD in.  Habits & Providers  Alcohol-Tobacco-Diet     Tobacco Status: current     Tobacco Counseling: to quit use of tobacco products     Cigarette Packs/Day: cigars 1/day     Year Quit: 2009  Current Problems (verified): 1)  Contraceptive Management  (ICD-V25.09) 2)  Menorrhagia  (ICD-626.2) 3)  Amenorrhea  (ICD-626.0) 4)  Tobacco Abuse  (ICD-305.1) 5)  Dental Caries  (ICD-521.00) 6)  Insomnia-sleep Disorder-unspec  (ICD-780.52) 7)  Depression  (ICD-311) 8)  Pelvic Pain, Left  (ICD-789.09) 9)  Impaired Glucose Tolerance  (ICD-790.22) 10)  Obesity  (ICD-278.00) 11)  Polycystic Ovary Syndrome  (ICD-256.4) 12)  Hypertension  (ICD-401.1)  Current Medications (verified): 1)  Hydrochlorothiazide 25 Mg Tabs (Hydrochlorothiazide) .... Take One (1) By Mouth Daily 2)  Trazodone Hcl 100 Mg Tabs (Trazodone Hcl) .Marland Kitchen.. 1 By Mouth At Bedtime As Needed Insomnia.  Do Not Take With Ambien 3)  Provera 5 Mg Tabs (Medroxyprogesterone Acetate) .Marland Kitchen.. 1 By Mouth Daily X 5 Days On The 15th of Odd Months 4)  Doxepin Hcl 10 Mg Caps (Doxepin Hcl) .Marland Kitchen.. 1 By Mouth Q Hs 5)  Diclofenac Sodium 75 Mg Tbec (Diclofenac Sodium) .Marland Kitchen.. 1 By Mouth Two Times A Day As Needed Cramping 6)  Cytotec 200 Mcg Tabs (Misoprostol) .... 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    Physical Exam  General:  alert and well-developed.   Head:  normocephalic and atraumatic.   Neck:  supple.   Lungs:  normal respiratory effort.   Heart:  normal rate.   Abdomen:  soft.   Genitalia:  normal introitus, mucosa pink and moist, no vaginal or cervical lesions, and normal uterus size and position.    Procedure:  Cervix grasped with single-tooth tenaculum.  Sounded to 8 cm.  Mirena IUD placed easily.  Pt. tolerated well.   Impression & Recommendations:  Problem # 1:  ENC FOR INSERTION INTRAUTERINE CONTRACEPT DEVICE (ICD-V25.11)  Orders: IUD insert- [facility] (84132)  Complete Medication List: 1)  Hydrochlorothiazide 25 Mg Tabs (Hydrochlorothiazide) .... Take one (1) by mouth daily 2)  Trazodone Hcl 100 Mg Tabs (Trazodone hcl) .Marland Kitchen.. 1 by mouth  at bedtime as needed insomnia.  do not take with ambien 3)  Provera 5 Mg Tabs (Medroxyprogesterone acetate) .Marland Kitchen.. 1 by mouth daily x 5 days on the 15th of odd months 4)  Doxepin Hcl 10 Mg Caps (Doxepin hcl) .Marland Kitchen.. 1 by mouth q hs 5)  Diclofenac Sodium 75 Mg Tbec (Diclofenac sodium) .Marland Kitchen.. 1 by mouth two times a day as needed cramping 6)  Cytotec 200 Mcg Tabs (Misoprostol) .... 2 by mouth at hs evening prior to appointment and 2 po am of appointment  Other Orders: U Preg-[facility]  (16109)  Patient Instructions: 1)  Please schedule a follow-up appointment in 1 month.    Orders Added: 1)  U Preg-[facility] [81025] 2)  IUD insert- [facility] [58300]    Laboratory Results   Urine Tests  Date/Time Received: ............................................... Shanda Bumps November 20, 2010 1:36 PM   Date/Time Reported: November 20, 2010 1:56 PM     Urine HCG: negative Comments: ............................................... Shanda Bumps [facility] November 20, 2010 1:56 PM
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
## 2011-01-23 NOTE — Assessment & Plan Note (Signed)
Summary: IUD prob/acm   Vital Signs:  Patient profile:   30 year old female Height:      63 inches Weight:      210.7 pounds BMI:     37.46 Temp:     98.2 degrees F oral Pulse rate:   101 / minute BP sitting:   131 / 83  (left arm) Cuff size:   regular  Vitals Entered By: Garen Grams LPN (November 28, 2010 2:42 PM) CC: bleeding and pain from IUD Is Patient Diabetic? No Pain Assessment Patient in pain? yes     Location: abdomen   CC:  bleeding and pain from IUD.  History of Present Illness: 1) IUD: IUD placed on 11/28. Patient reports sharp, intermittent pelvic pain, and bleeding w/ spotting to regular volume associated with periods since. Reports LMP was 2 weeks ago (review of notes reveals that patient had not had a period and had been placed on Provera for this prior to insertion of Mirena). Denies nausea, vomiting or diarrhea, vaginal discharge, fevers / chills. Has not been sexually active since placement of device. Has not taken anything for pain.     Habits & Providers  Alcohol-Tobacco-Diet     Tobacco Status: current     Tobacco Counseling: to quit use of tobacco products     Cigarette Packs/Day: cigars 1/day     Year Quit: 2009  Allergies (verified): No Known Drug Allergies  Physical Exam  General:  obese, NAD  Abdomen:  soft, no masses, no rebound or guarding,+ mildly tender to palpation pelvic region  Genitalia:  normal introitus, no external lesions, no vaginal discharge, mucosa pink and moist, no vaginal or cervical lesions, and normal uterus size and position, +ve mild adnexal tenderness, +ve blood in vaginal vault, IUD strings in place in cervix    Impression & Recommendations:  Problem # 1:  PELVIC  PAIN (ICD-789.09) Assessment New  Likely secondary to Mirena placement and subsequent changes in hormonal milieu vs. normal menstruation. Advised regarding red flag symptoms. No peritoneal signs on exam. Advised to keep appointment with Dr. Shawnie Pons in  three weeks. Ibuprofen given for prostaglandin inhibition - to reduce pain and bleeding.   Orders: [facility]- Est Level  3 (84132)  Complete Medication List: 1)  Hydrochlorothiazide 25 Mg Tabs (Hydrochlorothiazide) .... Take one (1) by mouth daily 2)  Trazodone Hcl 100 Mg Tabs (Trazodone hcl) .Marland Kitchen.. 1 by mouth at bedtime as needed insomnia.  do not take with ambien 3)  Doxepin Hcl 10 Mg Caps (Doxepin hcl) .Marland Kitchen.. 1 by mouth q hs 4)  Ibuprofen 800 Mg Tabs (Ibuprofen) .... One tab by mouth three times a day x 2 weeks  Patient Instructions: 1)  Follow up with Dr. Shawnie Pons as scheduled. 2)  If you notice fevers, nausea, vomiting, worsening abdominal pain or other concerns come back in to be seen  3)  Take ibuprofen as scheduled (do not take diclofenac while you are taking this) Prescriptions: IBUPROFEN 800 MG TABS (IBUPROFEN) one tab by mouth three times a day x 2 weeks  #42 x 0   Entered and Authorized by:   Bobby Rumpf  MD   Signed by:   Bobby Rumpf  MD on 11/28/2010   Method used:   Electronically to        [facility]  [phone]* (retail)       [address]       [place], [place]  [id number]       Ph: [phone] or [phone]       Fax: [phone]   RxID:   [id number]    Orders Added: 1)  [facility]- Est Level  3 [44010]

## 2011-01-23 NOTE — Assessment & Plan Note (Signed)
Summary: problem with medication,tcb   Vital Signs:  Patient profile:   30 year old female Height:      63 inches Weight:      197.3 pounds BMI:     35.08 Temp:     98.1 degrees F oral Pulse rate:   76 / minute BP sitting:   116 / 74  (left arm) Cuff size:   large  Vitals Entered By: Gladstone Pih (June 08, 2010 8:55 AM) CC: C/O Not sleeping and no diff since starting the Zoloft on her last visit Is Patient Diabetic? No Pain Assessment Patient in pain? no        History of Present Illness: Started Zoloft last visit, but still with sleep problems.  Tries Tylenol PM. Falls asleep sometimes, but then up 1 hour later.  Takes Zoloft in am.  Falls back asleep, then frequent awakening through the night.  Watches TV.  Works from 2-10, sometimes able to sleep from around 2 am until 1 pm.   No improvement in depressive symptomss.  Feels sad. + anhedonia, no guilt, + problems concentrating,Low energy,appetite increased a little, +psychomotor retardation, No SI.  Feels Zoloft has not helped at all, would like to change meds.  No h/o of being on meds.  Has seen a therapist in past, whch may have helped.  Also reports signifant social stressors: long distance boyfriend supposedly in jail (according to his friends), but she has contacted the jail and they do not have him there.  Denies violence.    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  Pleasant and cooperative with slightly flat affect.  Nl TC/TP without FOI/LOA.  Speech with normal rate.   Impression & Recommendations:  Problem # 1:  DEPRESSION (ICD-311)  Given her trial of Zoloft since 05/02/10 and no effect, will switch to another medicine.  Since pt has no insurance, another $4 med would be best.  Will try paroxetine; pt has no plans to become pregnant.  Reviewed side effects with pt.  She contracts for safety.  Follow up in 2 weeks with Dr. Shawnie Pons or me if Dr. Shawnie Pons is not available. Pt would also likely benefit from therapy.  She was given the card for Dr. Pascal Lux and will call to schedule an appt. The following medications were removed from the medication list:    Zoloft 100 Mg Tabs (Sertraline hcl) .Marland Kitchen... 1/2 by mouth daily x 2 wks, then 1 by mouth daily Her updated medication list for this problem includes:    Paroxetine Hcl 20 Mg Tabs (Paroxetine hcl) .Marland Kitchen... 1 by mouth daily    Trazodone Hcl 100 Mg Tabs (Trazodone hcl) .Marland Kitchen... 1 by mouth at night as needed insomnia  Orders: [facility]- Est Level  3 (10258)  Problem # 2:  INSOMNIA-SLEEP DISORDER-UNSPEC (ICD-780.52)  Trazadone for sleep.  Reviewed sleep hygeine.  F/u 2 weeks.  Orders: [facility]- Est Level  3 (52778)  Complete  Medication List: 1)  Hydrochlorothiazide 25 Mg Tabs (Hydrochlorothiazide) .... Take one (1) by mouth daily 2)  Diclofenac Sodium 75 Mg Tbec (Diclofenac sodium) .Marland Kitchen.. 1 by mouth two times a day as needed 3)  Oxycodone-acetaminophen 5-500 Mg Caps (Oxycodone-acetaminophen) .Marland Kitchen.. 1 by mouth q 4-6 hrs as needed 4)  Camila 0.35 Mg Tabs (Norethindrone) .Marland Kitchen.. 1 by mouth daily 5)  Paroxetine Hcl 20 Mg Tabs (Paroxetine hcl) .Marland Kitchen.. 1 by mouth daily 6)  Trazodone Hcl 100 Mg Tabs (Trazodone hcl) .Marland Kitchen.. 1 by mouth at night as needed insomnia  Patient Instructions: 1)  We will stop the Zoloft and start paroxetine.  It may take a few weeks before you start feeling better.   2)  We will also add trazodone  to help you sleep at night.  You can take 1/2 to 1 pill as needed if you can't sleep. 3)  Try to do the things we talked about to help you sleep (no TV in bed, limit caffeine, exercise during the day, and try to go to sleep very night at the same time) 4)  Please schedule a follow up appointment in 2 weeks with Dr. Shawnie Pons. 5)  Please call Dr. Pascal Lux to schedule an appointment for counseling. Prescriptions: TRAZODONE HCL 100 MG TABS (TRAZODONE HCL) 1 by mouth at night as needed insomnia  #30 x 0   Entered and Authorized by:   Sarah Swaziland MD   Signed by:   Sarah Swaziland MD on 06/08/2010   Method used:   Electronically to        [facility]  [phone]* (retail)       [address]       [place], [place]  [address]       Ph: [phone] or [phone]       Fax: [phone]   RxID:   [id number] PAROXETINE HCL 20 MG TABS (PAROXETINE HCL) 1 by mouth daily  #30 x 3   Entered and Authorized by:   Sarah Swaziland MD   Signed by:   Sarah Swaziland MD on 06/08/2010   Method used:   Electronically to        [facility]  [phone]* (retail)       [address]       [place], [place]  [address]       Ph: [phone] or [phone]       Fax: [phone]   RxID:   [id number]

## 2011-01-23 NOTE — Progress Notes (Signed)
Summary: Schedule initial beh med  Phone Note Call from Patient   Caller: Patient Call For: Spero Geralds, Psy.D. Summary of Call: Patient left VM to schedule an appt.  Reviewed Dr. Tawni Levy note and called patient to schedule.  Left a VM. Initial call taken by: Spero Geralds PsyD,  June 20, 2010 4:03 PM  Follow-up for Phone Call        Christyana called back.  We scheduled for August 1st at 9:00.  She denied safety issues.  This was my first available new patient appt. Follow-up by: Spero Geralds PsyD,  June 20, 2010 4:34 PM

## 2011-01-23 NOTE — Progress Notes (Signed)
Summary: iud ?  Phone Note Call from Patient Call back at Home Phone [phone]   Caller: Patient Summary of Call: pt has not started period and has appt to have IUD placed today @ 215 with dr. Shawnie Pons and was told that she had to be on her period for this to be done. please advise Initial call taken by: Loralee Pacas CMA,  November 06, 2010 12:26 PM

## 2011-01-23 NOTE — Progress Notes (Signed)
Summary: Schedule Beh med  Phone Note Call from Patient   Caller: Patient Call For: Spero Geralds, Psy.D. Summary of Call: Patient called to schedule.  She works in the afternoon and will be starting school shortly.  Scheduled for Tuesday, November 8th at 1:30.   Initial call taken by: Spero Geralds PsyD,  October 18, 2010 4:02 PM

## 2011-01-23 NOTE — Progress Notes (Signed)
Summary: Reschedule Beh med  Phone Note Call from Patient   Caller: Patient Call For: Spero Geralds, Psy.D. Summary of Call: Patient called to cancel appt this a.m. secondary to an upset stomach.  We rescheduled the next available which is Monday the 12th at 9:00. Initial call taken by: Spero Geralds PsyD,  August 22, 2010 8:40 AM
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
## 2011-01-23 NOTE — Assessment & Plan Note (Signed)
Summary: f/u eo   Vital Signs:  Patient profile:   30 year old female Height:      63 inches Weight:      199.9 pounds BMI:     35.54 Temp:     98.1 degrees F oral Pulse rate:   86 / minute BP sitting:   100 / 71  (left arm) Cuff size:   regular  Vitals Entered By: Garen Grams LPN (June 30, 9561 4:09 PM) CC: f/u sleep, depression Is Patient Diabetic? No Pain Assessment Patient in pain? no        CC:  f/u sleep and depression.  History of Present Illness: Pt. reports feeling better this week after change in medication.  She has made an appointment with Dr. Pascal Lux.  She continues to sufer from insomnia.  Denies suicidality. Needs dental referral.  Habits & Providers  Alcohol-Tobacco-Diet     Tobacco Status: never  Current Problems (verified): 1)  Dental Caries  (ICD-521.00) 2)  Insomnia-sleep Disorder-unspec  (ICD-780.52) 3)  Depression  (ICD-311) 4)  Pelvic Pain, Left  (ICD-789.09) 5)  Low Back Pain  (ICD-724.2) 6)  Impaired Glucose Tolerance  (ICD-790.22) 7)  Family History Diabetes 1st Degree Relative  (ICD-V18.0) 8)  Obesity  (ICD-278.00) 9)  Polycystic Ovary Syndrome  (ICD-256.4) 10)  Hypertension  (ICD-401.1)  Current Medications (verified): 1)  Hydrochlorothiazide 25 Mg Tabs (Hydrochlorothiazide) .... Take One (1) By Mouth Daily 2)  Diclofenac Sodium 75 Mg Tbec (Diclofenac Sodium) .Marland Kitchen.. 1 By Mouth Two Times A Day As Needed 3)  Oxycodone-Acetaminophen 5-500 Mg Caps (Oxycodone-Acetaminophen) .Marland Kitchen.. 1 By Mouth Q 4-6 Hrs As Needed 4)  Camila 0.35 Mg Tabs (Norethindrone) .Marland Kitchen.. 1 By Mouth Daily 5)  Paroxetine Hcl 20 Mg Tabs (Paroxetine Hcl) .Marland Kitchen.. 1 By Mouth Daily 6)  Ambien 5 Mg Tabs (Zolpidem Tartrate) .Marland Kitchen.. 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    Physical Exam  General:  alert and well-developed.   Head:  normocephalic and atraumatic.   Neck:  supple.   Lungs:  normal respiratory effort.   Heart:  normal rate.   Abdomen:  soft and non-tender.   Psych:  Oriented X3, memory intact for recent and remote, normally interactive, and good eye contact.     Impression & Recommendations:  Problem # 1:  DEPRESSION (ICD-311)  Her updated medication list for this problem includes:    Paroxetine Hcl 20 Mg Tabs (Paroxetine hcl) .Marland Kitchen... 1 by mouth daily  Orders: [facility]- Est Level  3 (13086)  Problem # 2:  INSOMNIA-SLEEP DISORDER-UNSPEC (ICD-780.52)  sleep hygiene reviewed, handout given Her updated medication list for this problem includes:    Ambien 5 Mg Tabs (Zolpidem tartrate) .Marland Kitchen... 1-2 by mouth q hs as needed insomnia  Orders: [facility]- Est Level  3 (57846)  Complete Medication List: 1)  Hydrochlorothiazide 25 Mg Tabs (Hydrochlorothiazide) .... Take one (1) by mouth daily 2)  Diclofenac Sodium 75 Mg Tbec (Diclofenac sodium) .Marland Kitchen.. 1 by mouth two times a day as needed 3)  Oxycodone-acetaminophen 5-500 Mg Caps (Oxycodone-acetaminophen) .Marland Kitchen.. 1 by mouth q 4-6 hrs as needed 4)  Camila 0.35 Mg Tabs (Norethindrone) .Marland Kitchen.. 1 by mouth daily 5)  Paroxetine Hcl 20 Mg Tabs (Paroxetine hcl) .Marland Kitchen.. 1 by mouth daily 6)  Ambien 5 Mg Tabs (Zolpidem tartrate) .Marland Kitchen.. 1-2 by mouth q hs as needed insomnia  Other Orders: Dental Referral (Dentist)  Patient  Instructions: 1)  Please schedule a follow-up appointment in 3 weeks.

## 2011-01-23 NOTE — Progress Notes (Signed)
Summary: Reschedule beh med  Phone Note Call from Patient   Caller: Patient Call For: Spero Geralds, Psy.D. Summary of Call: Patient called and can't attend Monday appt.  Rescheduled for August 10th at 9:00. Initial call taken by: Spero Geralds PsyD,  July 21, 2010 1:47 PM
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
## 2011-01-23 NOTE — Assessment & Plan Note (Signed)
Summary: Behavioral Medicine Follow-up   History of Present Illness: Sid Falcon reports that her aunt Bjorn Loser died shortly after she went to visit her.  She has already been back for the funeral.  She attended the wake which was for family only and since then, Sid Falcon has not been able to sleep at all well.  She awakes to images of her aunt the last time she saw her or lying in the casket.  These are very disturbing to her and tap not only into her loss but also into some death anxiety.  Discussed tendency toward impulsive decisions in relationships as well.  Allergies: No Known Drug Allergies   Impression & Recommendations:  Problem # 1:  INSOMNIA-SLEEP DISORDER-UNSPEC (ICD-780.52) Assessment Deteriorated  Worse since death of aunt.  Discussed pairing image with some positive thought rather than her current approach which is to try to block it out.  This will hopefully get better over time but need to check in.  On the way out, she asked about making an appt with Dr. Shawnie Pons to explore a sleep med.  She had reported earlier that Ambien nor Trazodone has been effective.  OTCs have been tried as well.  Encouraged her to make an appt to discuss the issue but cautioned her that she might not be able to find relief in a medication given she has been tried on some good sleepers.  Seroquel might me a nice match for sleep and mood but weight gain is an issue.  Of note, she views her sleep hygiene as adequate and yet her report of this does not lead me to think behavioral strategies have been exhausted.  Her updated medication list for this problem includes:    Ambien 5 Mg Tabs (Zolpidem tartrate) .Marland Kitchen... 1-2 by mouth q hs as needed insomnia  Orders: Therapy 40-50- min- [facility] (16109)  Problem # 2:  DEPRESSION (ICD-311)  Continues to report depressed mood and difficulty getting out of bed in the morning.  Contrast this with adequate work function and energy to pursue sexual and dating relationships.  Revisited  the issue of Bipolar Disorder.  No reported family history.  Negative MDQ and no clear picture of it here.  Cultural issues might be influencing behavior as well as potential Axis II characteristics (impulsivity and unstable sense of self).  Her updated medication list for this problem includes:    Paxil 20 Mg Tabs (Paroxetine hcl) .Marland Kitchen... 1 by mouth two times a day    Trazodone Hcl 100 Mg Tabs (Trazodone hcl) .Marland Kitchen... 1 by mouth at bedtime as needed insomnia.  do not take with Remus Loffler  Orders: Therapy 40-50- min- [facility] (60454)  Complete Medication List: 1)  Hydrochlorothiazide 25 Mg Tabs (Hydrochlorothiazide) .... Take one (1) by mouth daily 2)  Paxil 20 Mg Tabs (Paroxetine hcl) .Marland Kitchen.. 1 by mouth two times a day 3)  Ambien 5 Mg Tabs (Zolpidem tartrate) .Marland Kitchen.. 1-2 by mouth q hs as needed insomnia 4)  Trazodone Hcl 100 Mg Tabs (Trazodone hcl) .Marland Kitchen.. 1 by mouth at bedtime as needed insomnia.  do not take with ambien 5)  Cyclobenzaprine Hcl 10 Mg Tabs (Cyclobenzaprine hcl) .... Take 1 at night for muscle spasm.  may make you sleepy 6)  Provera 5 Mg Tabs (Medroxyprogesterone acetate) .Marland Kitchen.. 1 by mouth daily x 5 days on the 15th of odd months

## 2011-01-23 NOTE — Assessment & Plan Note (Signed)
Summary: Behavioral Medicine Follow-up   History of Present Illness: Sid Falcon reports she is headed home after our therapy session.  She says her family really wants her to visit so they put some medicine in her account and now she has gas money.  She plans to take her cousin, who is terminally ill, to a doctor's appt and reports feeling prepared for this visit.  She said she did write and send a letter to her as we discussed.    Sid Falcon also reports she got a dog and is going on a date with her best friend of 10 years.  Allergies: No Known Drug Allergies   Impression & Recommendations:  Problem # 1:  DEPRESSION (ICD-311)  Report of mood is good.  Affect is bright.  Thoughts are clear and goal directed.  Insight - I would say - is below what I would hope.  Her actions recently seem to be an abrupt shift from where we last left things a week ago.  This is concerning.  She questions if it is a "bipolar thing."  Discussed this some.  Earlier MDQ was negative with four positive responses.  Did think about mood charting at that time but did not follow through.  If she remains concerned about Bipolar, might be wise to get a sense of mood lability.    Sleep pattern is still erratic with long naps and staying up late.  This does not seem to bother her.  Floated the idea of "thinking" through things rather than "reacting" using the situation with Vi as the potential reason she is making so many changes.  She reports she thinks things through a great deal (perhaps it just looks impulsive).  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  Will follow on October 5th at 11:00.     Her updated medication list for this problem includes:    Paxil 20 Mg Tabs (Paroxetine hcl) .Marland Kitchen... 1 by mouth two times a day    Trazodone Hcl 100 Mg Tabs (Trazodone hcl) .Marland Kitchen... 1 by mouth at bedtime as needed insomnia.  do not take with Remus Loffler  Orders: Therapy 40-50- min- [facility] (16109)  Complete  Medication List: 1)  Hydrochlorothiazide 25 Mg Tabs (Hydrochlorothiazide) .... Take one (1) by mouth daily 2)  Paxil 20 Mg Tabs (Paroxetine hcl) .Marland Kitchen.. 1 by mouth two times a day 3)  Ambien 5 Mg Tabs (Zolpidem tartrate) .Marland Kitchen.. 1-2 by mouth q hs as needed insomnia 4)  Trazodone Hcl 100 Mg Tabs (Trazodone hcl) .Marland Kitchen.. 1 by mouth at bedtime as needed insomnia.  do not take with ambien 5)  Cyclobenzaprine Hcl 10 Mg Tabs (Cyclobenzaprine hcl) .... Take 1 at night for muscle spasm.  may make you sleepy 6)  Provera 5 Mg Tabs (Medroxyprogesterone acetate) .Marland Kitchen.. 1 by mouth daily x 5 days on the 15th of odd months

## 2011-01-23 NOTE — Progress Notes (Signed)
Summary: rx request   Phone Note Call from Patient   Caller: Patient Summary of Call: pt called and stated has several cramp and will lke to have stronger medication  Initial call taken by: Marines Jean Rosenthal,  October 06, 2010 12:13 PM  Follow-up for Phone Call        Hey-Can you call pt and find out what she needs? T (copy and pasted from flag) Follow-up by: Jone Baseman CMA,  October 09, 2010 4:00 PM  Additional Follow-up for Phone Call Additional follow up Details #1::        Called and LMOVM for pt to return call with more details Additional Follow-up by: Jone Baseman CMA,  October 09, 2010 4:01 PM
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
## 2011-01-23 NOTE — Assessment & Plan Note (Signed)
Summary: BIRTH CONTROL/[facility]   Vital Signs:  Patient profile:   30 year old female Height:      63 inches Weight:      202 pounds BMI:     35.91 Temp:     98.0 degrees F Pulse rate:   92 / minute BP sitting:   133 / 87  (left arm) Cuff size:   regular  Vitals Entered By: Garen Grams LPN (October 13, 2010 12:07 PM) CC: wants to start birth control Is Patient Diabetic? No Pain Assessment Patient in pain? no        CC:  wants to start birth control.  History of Present Illness: Wants iron check.  Having heavy vaginal bleeding with cycles.  Wants IUD for contraception.  Also, hopeful it will diminish bleeding and cramping.  Not candidate for OC's secondary to BP. Still not sleeping well.  Re-emphasized sleep hygiene.  Needs flu shot. Ibuprofen is not working for cramping.  Habits & Providers  Alcohol-Tobacco-Diet     Tobacco Status: current     Tobacco Counseling: to quit use of tobacco products     Cigarette Packs/Day: cigars 1/day     Year Quit: 2009  Current Problems (verified): 1)  Menorrhagia  (ICD-626.2) 2)  Amenorrhea  (ICD-626.0) 3)  Tobacco Abuse  (ICD-305.1) 4)  Dental Caries  (ICD-521.00) 5)  Insomnia-sleep Disorder-unspec  (ICD-780.52) 6)  Depression  (ICD-311) 7)  Pelvic Pain, Left  (ICD-789.09) 8)  Low Back Pain  (ICD-724.2) 9)  Impaired Glucose Tolerance  (ICD-790.22) 10)  Family History Diabetes 1st Degree Relative  (ICD-V18.0) 11)  Obesity  (ICD-278.00) 12)  Polycystic Ovary Syndrome  (ICD-256.4) 13)  Hypertension  (ICD-401.1)  Current Medications (verified): 1)  Hydrochlorothiazide 25 Mg Tabs (Hydrochlorothiazide) .... Take One (1) By Mouth Daily 2)  Paxil 20 Mg Tabs (Paroxetine Hcl) .Marland Kitchen.. 1 By Mouth Two Times A Day 3)  Trazodone Hcl 100 Mg Tabs (Trazodone Hcl) .Marland Kitchen.. 1 By Mouth At Bedtime As Needed Insomnia.  Do Not Take With Ambien 4)  Provera 5 Mg Tabs (Medroxyprogesterone Acetate) .Marland Kitchen.. 1 By Mouth Daily X 5 Days On The 15th of Odd Months 5)   Doxepin Hcl 10 Mg Caps (Doxepin Hcl) .Marland Kitchen.. 1 By Mouth Q Hs 6)  Diclofenac Sodium 75 Mg Tbec (Diclofenac Sodium) .Marland Kitchen.. 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    Physical Exam  General:  alert, well-developed, and well-nourished.   Neck:  supple.   Lungs:  normal respiratory effort.   Heart:  normal rate.   Abdomen:  soft and non-tender.   Psych:  Oriented X3, normally interactive, good eye contact, not anxious appearing, not depressed appearing, and not suicidal.     Impression & Recommendations:  Problem # 1:  MENORRHAGIA (ICD-626.2) [facility] for IUD--Take provera 3 days prior to coming in for IUD insertion. Her updated medication list for  this problem includes:    Provera 5 Mg Tabs (Medroxyprogesterone acetate) .Marland Kitchen... 1 by mouth daily x 5 days on the 15th of odd months  Orders: [facility]- Est Level  3 (99213) [facility] (29562)  Problem # 2:  INSOMNIA-SLEEP DISORDER-UNSPEC (ICD-780.52)  The following medications were removed from the medication list:    Ambien 5 Mg Tabs (Zolpidem tartrate) .Marland Kitchen... 1-2 by mouth q hs as needed insomnia  Orders: [facility]- Est Level  3 (13086)  Problem # 3:  DEPRESSION (ICD-311)  Her updated medication list  for this problem includes:    Paxil 20 Mg Tabs (Paroxetine hcl) .Marland Kitchen... 1 by mouth two times a day    Trazodone Hcl 100 Mg Tabs (Trazodone hcl) .Marland Kitchen... 1 by mouth at bedtime as needed insomnia.  do not take with ambien    Doxepin Hcl 10 Mg Caps (Doxepin hcl) .Marland Kitchen... 1 by mouth q hs  Orders: [facility]- Est Level  3 (57846)  Problem # 4:  HYPERTENSION (ICD-401.1)  Her updated medication list for this problem includes:    Hydrochlorothiazide 25 Mg Tabs (Hydrochlorothiazide) .Marland Kitchen... Take one (1) by mouth daily  Complete Medication List: 1)  Hydrochlorothiazide 25 Mg Tabs (Hydrochlorothiazide) .... Take one (1) by mouth daily 2)  Paxil 20 Mg Tabs (Paroxetine hcl) .Marland Kitchen.. 1 by mouth two times a day 3)  Trazodone Hcl 100 Mg Tabs (Trazodone hcl) .Marland Kitchen.. 1 by mouth at bedtime as needed insomnia.  do not take with ambien 4)  Provera 5 Mg Tabs (Medroxyprogesterone acetate) .Marland Kitchen.. 1 by mouth daily x 5 days on the 15th of odd months 5)  Doxepin Hcl 10 Mg Caps (Doxepin hcl) .Marland Kitchen.. 1 by mouth q hs 6)  Diclofenac Sodium 75 Mg Tbec (Diclofenac sodium) .Marland Kitchen.. 1 by mouth two times a day as needed cramping  Patient Instructions: 1)  Please schedule a follow-up appointment in 1 month for IUD insertion.  Prescriptions: DICLOFENAC SODIUM 75 MG TBEC (DICLOFENAC SODIUM) 1 by mouth two times a day as needed cramping  #30 x 3   Entered and Authorized by:   Tinnie Gens MD   Signed by:   Tinnie Gens MD on 10/13/2010   Method used:   Electronically to        [facility]  [phone]* (retail)       [address]       [place], [place]  [address]       Ph: [phone] or [phone]       Fax: [phone]   RxID:   [id number] DOXEPIN HCL 10 MG CAPS (DOXEPIN HCL) 1 by mouth q hs  #30 x 3   Entered and Authorized by:   Tinnie Gens MD   Signed by:   Tinnie Gens MD on 10/13/2010   Method used:   Electronically to        [facility]  [phone]* (retail)       [address]        [place], [place]  [address]       Ph: [phone] or [phone]       Fax: [phone]   RxID:   [id number] PROVERA 5 MG TABS (MEDROXYPROGESTERONE ACETATE) 1 by mouth daily x 5 days on the 15th of odd months  #30 x 2   Entered and Authorized by:   Tinnie Gens MD   Signed by:   Tinnie Gens MD on 10/13/2010  Method used:   Electronically to        [facility]  [phone]* (retail)       [address]       [place], [place]  [address]       Ph: [phone] or [phone]       Fax: [phone]   RxID:   [id number]    Orders Added: 1)  [facility]- Est Level  3 [99213] 2)  [facility] [85027]  Appended Document: BIRTH CONTROL/[facility]   Immunizations Administered:  Influenza Vaccine # 1:    Vaccine Type: Fluvax 3+    Site: right deltoid    Mfr: GlaxoSmithKline    Dose: 0.5 ml    Route: IM    Given by: Jone Baseman CMA    Exp. Date: 06/20/2011    Lot #: [id number]    VIS given: 07/18/10 version given October 13, 2010.  Flu Vaccine Consent Questions:    Do you have a history of severe allergic reactions to this vaccine? no    Any prior history of allergic reactions to egg and/or gelatin? no    Do you have a sensitivity to the preservative Thimersol? no    Do you have a past history of Guillan-Barre Syndrome? no    Do you currently have an acute febrile illness? no    Have you ever had a severe reaction to latex? no    Vaccine information given and explained to patient? yes    Are you currently pregnant? no

## 2011-01-23 NOTE — Progress Notes (Signed)
Summary: triage  Phone Note Call from Patient Call back at Home Phone [phone]   Caller: Patient Summary of Call: Pt taking Zoloft and having trouble sleeping. Initial call taken by: Clydell Hakim,  May 17, 2010 11:23 AM  Follow-up for Phone Call        she usually take it at 10pm. suggested trial of taking it in am instead. she agreed to try that. will let us know in a week if that helped Follow-up by: Golden Circle RN,  May 17, 2010 11:50 AM

## 2011-01-23 NOTE — Assessment & Plan Note (Signed)
Summary: IUD,df   Vital Signs:  Patient profile:   30 year old female Height:      63 inches Weight:      204 pounds BMI:     36.27 BSA:     1.95 Temp:     98.2 degrees F Pulse rate:   80 / minute BP sitting:   129 / 87  Vitals Entered By: Jone Baseman CMA (November 06, 2010 2:12 PM) CC: IUD, URI symptoms Is Patient Diabetic? No Pain Assessment Patient in pain? no        CC:  IUD and URI symptoms.  History of Present Illness: Here for IUD insertion started provera on Friday, no bleeding yet.  UPT negative.      This is a 30 year old woman who presents with URI symptoms.  The patient complains of nasal congestion, purulent nasal discharge, productive cough, and sick contacts.  Associated symptoms include dyspnea and wheezing.  The patient denies fever, vomiting, and diarrhea.  The patient also reports muscle aches.  The patient denies response to antihistamine.    Habits & Providers  Alcohol-Tobacco-Diet     Tobacco Status: current     Tobacco Counseling: to quit use of tobacco products     Cigarette Packs/Day: cigars 1/day     Year Quit: 2009  Current Problems (verified): 1)  Contraceptive Management  (ICD-V25.09) 2)  Menorrhagia  (ICD-626.2) 3)  Amenorrhea  (ICD-626.0) 4)  Tobacco Abuse  (ICD-305.1) 5)  Dental Caries  (ICD-521.00) 6)  Insomnia-sleep Disorder-unspec  (ICD-780.52) 7)  Depression  (ICD-311) 8)  Pelvic Pain, Left  (ICD-789.09) 9)  Impaired Glucose Tolerance  (ICD-790.22) 10)  Obesity  (ICD-278.00) 11)  Polycystic Ovary Syndrome  (ICD-256.4) 12)  Hypertension  (ICD-401.1)  Current Medications (verified): 1)  Hydrochlorothiazide 25 Mg Tabs (Hydrochlorothiazide) .... Take One (1) By Mouth Daily 2)  Paxil 20 Mg Tabs (Paroxetine Hcl) .Marland Kitchen.. 1 By Mouth Two Times A Day 3)  Trazodone Hcl 100 Mg Tabs (Trazodone Hcl) .Marland Kitchen.. 1 By Mouth At Bedtime As Needed Insomnia.  Do Not Take With Ambien 4)  Provera 5 Mg Tabs (Medroxyprogesterone Acetate) .Marland Kitchen.. 1 By Mouth  Daily X 5 Days On The 15th of Odd Months 5)  Doxepin Hcl 10 Mg Caps (Doxepin Hcl) .Marland Kitchen.. 1 By Mouth Q Hs 6)  Diclofenac Sodium 75 Mg Tbec (Diclofenac Sodium) .Marland Kitchen.. 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   Head:  normocephalic and atraumatic.   Abdomen:  soft and non-tender.   Genitalia:  normal introitus, no external lesions, and no vaginal discharge Procedure:  Cervix grasped anteriorly with single tooth tenaculum, attempted to sound but cervix was not open.   Psych:  Oriented X3, memory intact for recent and remote, normally interactive, and good eye contact.     Impression & Recommendations:  Problem # 1:  UPPER RESPIRATORY INFECTION (URI) (ICD-465.9)  symptomatic rx only Her updated medication list for this problem includes:    Diclofenac Sodium  75 Mg Tbec (Diclofenac sodium) .Marland Kitchen... 1 by mouth two times a day as needed cramping  Orders: [facility]- Est Level  3 (29562)  Problem # 2:  CONTRACEPTIVE MANAGEMENT (ICD-V25.09)  Attempted IUD insertion.  Will repeat provera and trial with cytotec prior to re-insertion.  Orders: [facility] (81025) [facility]- Est Level  3 (13086)  Complete Medication List: 1)  Hydrochlorothiazide 25 Mg Tabs (Hydrochlorothiazide) .... Take one (1) by mouth daily 2)  Trazodone Hcl 100 Mg Tabs (Trazodone hcl) .Marland Kitchen.. 1 by  mouth at bedtime as needed insomnia.  do not take with ambien 3)  Provera 5 Mg Tabs (Medroxyprogesterone acetate) .Marland Kitchen.. 1 by mouth daily x 5 days on the 15th of odd months 4)  Doxepin Hcl 10 Mg Caps (Doxepin hcl) .Marland Kitchen.. 1 by mouth q hs 5)  Diclofenac Sodium 75 Mg Tbec (Diclofenac sodium) .Marland Kitchen.. 1 by mouth two times a day as needed cramping 6)  Cytotec 200 Mcg Tabs (Misoprostol) .... 2 by mouth at hs evening prior to appointment and 2 po am of appointment  Patient Instructions: 1)  Please schedule a follow-up appointment in 1 month for repeat trial of  IUD insertion.  Prescriptions: CYTOTEC 200 MCG TABS (MISOPROSTOL) 2 by mouth at hs evening prior to appointment and 2 po am of appointment  #4 x 1   Entered and Authorized by:   Tinnie Gens MD   Signed by:   Tinnie Gens MD on 11/06/2010   Method used:   Electronically to        [facility]  [phone]* (retail)       [address]       [place], [place]  [address]       Ph: [phone] or [phone]       Fax: [phone]   RxID:   [id number] CYTOTEC 200 MCG TABS (MISOPROSTOL) 2 by mouth at hs evening prior to appointment and 2 po am of appointment  #4 x 1   Entered and Authorized by:   Tinnie Gens MD   Signed by:   Tinnie Gens MD on 11/06/2010   Method used:   Print then Give to Patient   RxID:   [id number]    Orders Added: 1)  U Preg[facility] [81025] 2)  [facility]- Est Level  3 [99213]    Laboratory Results   Urine Tests  Date/Time Received: November 06, 2010 2:19 PM  Date/Time Reported: November 06, 2010 2:27 PM     Urine HCG: negative Comments: ...........test performed by...........Marland KitchenTerese Door, CMA
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
## 2011-01-23 NOTE — Assessment & Plan Note (Signed)
Summary: f/u eo   Vital Signs:  Patient profile:   30 year old female Height:      63 inches Weight:      216 pounds BMI:     38.40 BSA:     2.00 Temp:     98.4 degrees F Pulse rate:   81 / minute BP sitting:   133 / 84  Vitals Entered By: Jone Baseman CMA (February 07, 2010 9:32 AM) CC: f/u Is Patient Diabetic? No Pain Assessment Patient in pain? no        CC:  f/u.  History of Present Illness:  Hypertension follow-up      This is a 30 year old woman who presents for Hypertension follow-up.  The patient denies lightheadedness, edema, and fatigue.  Associated symptoms include chest pain, dyspnea, palpitations, syncope, and pedal edema.  Compliance with medications (by patient report) has been near 100%.  The patient reports that dietary compliance has been good.  The patient reports exercising daily.  Adjunctive measures currently used by the patient include salt restriction.    Habits & Providers  Alcohol-Tobacco-Diet     Tobacco Status: never  Current Problems (verified): 1)  Pelvic Pain, Left  (ICD-789.09) 2)  Low Back Pain  (ICD-724.2) 3)  Impaired Glucose Tolerance  (ICD-790.22) 4)  Family History Diabetes 1st Degree Relative  (ICD-V18.0) 5)  Obesity  (ICD-278.00) 6)  Polycystic Ovary Syndrome  (ICD-256.4) 7)  Hypertension  (ICD-401.1)  Current Medications (verified): 1)  Hydrochlorothiazide 25 Mg Tabs (Hydrochlorothiazide) .... Take One (1) By Mouth Daily 2)  Diclofenac Sodium 75 Mg Tbec (Diclofenac Sodium) .Marland Kitchen.. 1 By Mouth Two Times A Day As Needed 3)  Oxycodone-Acetaminophen 5-500 Mg Caps (Oxycodone-Acetaminophen) .Marland Kitchen.. 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   Head:  normocephalic and atraumatic.   Neck:  supple.   Lungs:  normal respiratory effort and normal breath sounds.   Heart:  normal rate, regular rhythm, and no murmur.   Abdomen:  soft and non-tender.   Msk:  normal ROM.     Impression & Recommendations:  Problem # 1:  HYPERTENSION (ICD-401.1)  Her updated medication list for this problem includes:    Hydrochlorothiazide 25 Mg Tabs (Hydrochlorothiazide) .Marland Kitchen... Take one (1) by mouth daily   Orders: [facility]- Est Level  2 (86578)  Problem # 2:  OBESITY (ICD-278.00) Starting diet and exercise program Orders: [facility]- Est Level  2 (46962)  Complete Medication List: 1)  Hydrochlorothiazide 25 Mg Tabs (Hydrochlorothiazide) .... Take one (1) by mouth daily 2)  Diclofenac Sodium 75 Mg Tbec (Diclofenac sodium) .Marland Kitchen.. 1 by mouth two times a day as needed 3)  Oxycodone-acetaminophen 5-500 Mg Caps (Oxycodone-acetaminophen) .Marland Kitchen.. 1 by mouth q 4-6 hrs as needed  Patient Instructions: 1)  Please schedule a follow-up appointment in 3 months .    Prevention & Chronic Care Immunizations   Influenza vaccine: Not documented    Tetanus booster: Not documented    Pneumococcal vaccine: Not documented  Other Screening   Pap smear: Not documented   Smoking status: never  (02/07/2010)  Hypertension   Last Blood Pressure: 133 / 84  (02/07/2010)   Serum creatinine: Not documented   Serum potassium Not documented   Basic metabolic panel due: 05/07/2010    Hypertension flowsheet reviewed?: Yes   Progress toward BP goal: At goal  Self-Management Support :    Hypertension self-management support: Not documented

## 2011-01-23 NOTE — Miscellaneous (Signed)
  Clinical Lists Changes  Problems: Removed problem of FAMILY HISTORY DIABETES 1ST DEGREE RELATIVE (ICD-V18.0) Removed problem of LOW BACK PAIN (ICD-724.2)
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
## 2011-01-23 NOTE — Assessment & Plan Note (Signed)
Summary: f/up,tcb   Vital Signs:  Patient profile:   30 year old female Height:      63 inches Weight:      204 pounds BMI:     36.27 BSA:     1.95 Temp:     98.2 degrees F Pulse rate:   81 / minute BP sitting:   124 / 84  Vitals Entered By: Jone Baseman CMA (May 02, 2010 9:08 AM) CC: f/u cyst, Depressive symptoms Is Patient Diabetic? No Pain Assessment Patient in pain? yes     Location: llq Intensity: 7   CC:  f/u cyst and Depressive symptoms.  History of Present Illness:  Hypertension follow-up      This is a 30 year old woman who presents for Hypertension follow-up.  The patient complains of fatigue, but denies lightheadedness, headaches, and edema.  The patient denies the following associated symptoms: chest pain, chest pressure, exercise intolerance, and palpitations.  Compliance with medications (by patient report) has been near 100%.  The patient reports that dietary compliance has been fair.  The patient reports exercising occasionally.    Depressive symptoms      The patient also presents with Depressive symptoms.  The patient reports depressed mood, loss of interest/pleasure, and psychomotor retardation.  The patient also reports fatigue or loss of energy, feelings of worthlessness, and diminished concentration.  The patient denies thoughts of suicide, suicidal intent, and suicidal plans.  The patient reports the following psychosocial stressors: recent death of a loved one.  The patient denies abnormally elevated mood, abnormally irritable mood, decreased need for sleep, increased talkativeness, distractibility, and flight of ideas.    Habits & Providers  Alcohol-Tobacco-Diet     Tobacco Status: never  Current Problems (verified): 1)  Depression  (ICD-311) 2)  Pelvic Pain, Left  (ICD-789.09) 3)  Low Back Pain  (ICD-724.2) 4)  Impaired Glucose Tolerance  (ICD-790.22) 5)  Family History Diabetes 1st Degree Relative  (ICD-V18.0) 6)  Obesity  (ICD-278.00) 7)   Polycystic Ovary Syndrome  (ICD-256.4) 8)  Hypertension  (ICD-401.1)  Current Medications (verified): 1)  Hydrochlorothiazide 25 Mg Tabs (Hydrochlorothiazide) .... Take One (1) By Mouth Daily 2)  Diclofenac Sodium 75 Mg Tbec (Diclofenac Sodium) .Marland Kitchen.. 1 By Mouth Two Times A Day As Needed 3)  Oxycodone-Acetaminophen 5-500 Mg Caps (Oxycodone-Acetaminophen) .Marland Kitchen.. 1 By Mouth Q 4-6 Hrs As Needed 4)  Camila 0.35 Mg Tabs (Norethindrone) .Marland Kitchen.. 1 By Mouth Daily 5)  Zoloft 100 Mg Tabs (Sertraline Hcl) .... 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    Physical Exam  General:  alert, well-developed, and well-nourished.   Head:  normocephalic and atraumatic.   Neck:  supple.   Lungs:  normal respiratory effort.   Heart:  normal rate.   Abdomen:  soft and non-tender.   Psych:  not anxious appearing, not suicidal, not homicidal,  depressed affect, flat affect, subdued, and tearful.     Impression & Recommendations:  Problem # 1:  PELVIC PAIN, LEFT (ICD-789.09)  Her updated medication list for this problem includes:    Diclofenac Sodium 75 Mg Tbec (Diclofenac sodium) .Marland Kitchen... 1 by mouth two times a day as needed     Oxycodone-acetaminophen 5-500 Mg Caps (Oxycodone-acetaminophen) .Marland Kitchen... 1 by mouth q 4-6 hrs as needed  Orders: [facility]- Est Level  3 (23536)  Problem # 2:  HYPERTENSION (ICD-401.1)  Her updated medication list for this problem includes:    Hydrochlorothiazide 25 Mg Tabs (Hydrochlorothiazide) .Marland Kitchen... Take one (1) by mouth daily  Orders: [facility]- Est Level  3 (14431)  Problem # 3:  DEPRESSION (ICD-311)  Orders: [facility]- Est Level  3 (54008)  Her updated medication list for this problem includes:    Zoloft 100 Mg Tabs (Sertraline hcl) .Marland Kitchen... 1/2 by mouth daily x 2 wks, then 1 by mouth daily  Complete Medication List: 1)  Hydrochlorothiazide 25 Mg Tabs (Hydrochlorothiazide) .... Take one (1) by mouth daily 2)  Diclofenac Sodium 75 Mg Tbec (Diclofenac sodium) .Marland Kitchen.. 1 by mouth two times a day as needed 3)  Oxycodone-acetaminophen 5-500 Mg Caps (Oxycodone-acetaminophen) .Marland Kitchen.. 1 by mouth q 4-6 hrs as needed 4)  Camila 0.35 Mg Tabs (Norethindrone) .Marland Kitchen.. 1 by mouth daily 5)  Zoloft 100 Mg Tabs (Sertraline hcl) .... 1/2 by mouth daily x 2 wks, then 1 by mouth daily  Patient Instructions: 1)  Please schedule a follow-up appointment in 1 month.  Prescriptions: HYDROCHLOROTHIAZIDE 25 MG TABS (HYDROCHLOROTHIAZIDE) Take one (1) by mouth daily  #30 x 3   Entered and Authorized by:   Tinnie Gens MD   Signed by:   Tinnie Gens MD on 05/02/2010   Method used:   Electronically to        [facility]  [phone]* (retail)       [address]       [place], [place]  [address]       Ph: [phone] or [phone]       Fax: [phone]   RxID:   [id number] ZOLOFT 100 MG TABS (SERTRALINE HCL) 1/2 by mouth daily x 2 wks, then 1 by mouth daily  #30 x 3   Entered and Authorized by:   Tinnie Gens MD   Signed by:   Tinnie Gens MD on 05/02/2010   Method used:   Electronically to        [facility]  [phone]* (retail)       [address]       [place], [place]  [address]       Ph: [phone] or [phone]       Fax: [phone]   RxID:   [id number]

## 2011-01-25 NOTE — Progress Notes (Signed)
Summary: Rx not received  Phone Note Call from Patient Call back at Home Phone [phone]   Reason for Call: Talk to Nurse Summary of Call: pt was seen by Dr. Shawnie Pons yesterday, could not get Rx bc printer was messed up, pt is asking for [place] to send them to her pharmacy, pt goes to [facility].  Initial call taken by: Knox Royalty,  December 20, 2010 1:42 PM  Follow-up for Phone Call        paged Dr. Shawnie Pons to verify that RX had not been given to patient and she states printer was not working properly and when she went to give rx to patient she had already left. rx called to pharmacy for celexa and ortho- cyclen. patient notified. Follow-up by: Theresia Lo RN,  December 20, 2010 4:11 PM

## 2011-01-25 NOTE — Assessment & Plan Note (Signed)
Summary: removal of IUD/bmc   Vital Signs:  Patient profile:   30 year old female Height:      63 inches Weight:      202.5 pounds BMI:     36.00 Temp:     98.5 degrees F oral Pulse rate:   93 / minute BP sitting:   119 / 83  (left arm) Cuff size:   regular  Vitals Entered By: Garen Grams LPN (January 18, 2011 8:50 AM) CC: IUD prob. Is Patient Diabetic? No Pain Assessment Patient in pain? no        Primary Care Provider:  Tinnie Gens MD  CC:  IUD prob.Marland Kitchen  History of Present Illness: Here today for IUD removal.  Initially made appt. for this secondary to bleeding.  She has now stopped bleeding and feels much improved.  She does not want her IUD out now.  Her mood is good today and she thinks her new meds are working much better for her.  She continues to work full-time and go to school.  She is studying accounting.  she is starting an exercise regimen with a new trainer.  Habits & Providers  Alcohol-Tobacco-Diet     Tobacco Status: current     Tobacco Counseling: to quit use of tobacco products     Cigarette Packs/Day: cigars 1/day     Year Quit: 2009  Current Problems (verified): 1)  Intrauterine Contraceptive Device Surveillance  (ICD-V25.42) 2)  Contraceptive Management  (ICD-V25.09) 3)  Menorrhagia  (ICD-626.2) 4)  Amenorrhea  (ICD-626.0) 5)  Tobacco Abuse  (ICD-305.1) 6)  Dental Caries  (ICD-521.00) 7)  Insomnia-sleep Disorder-unspec  (ICD-780.52) 8)  Depression  (ICD-311) 9)  Pelvic Pain  (ICD-789.09) 10)  Impaired Glucose Tolerance  (ICD-790.22) 11)  Obesity  (ICD-278.00) 12)  Polycystic Ovary Syndrome  (ICD-256.4) 13)  Hypertension  (ICD-401.1)  Current Medications (verified): 1)  Hydrochlorothiazide 25 Mg Tabs (Hydrochlorothiazide) .... Take One (1) By Mouth Daily 2)  Trazodone Hcl 100 Mg Tabs (Trazodone Hcl) .Marland Kitchen.. 1 By Mouth At Bedtime As Needed Insomnia.  Do Not Take With Ambien 3)  Doxepin Hcl 10 Mg Caps (Doxepin Hcl) .Marland Kitchen.. 1 By Mouth Q Hs 4)   Ibuprofen 800 Mg Tabs (Ibuprofen) .... One Tab By Mouth Three Times A Day X 2 Weeks 5)  Celexa 20 Mg Tabs (Citalopram Hydrobromide) .... 1/2 Tab By Mouth Daily X 3 Days, Then 1 Tab By Mouth Daily 6)  Ortho-Cyclen (28) 0.25-35 Mg-Mcg Tabs (Norgestimate-Eth Estradiol) .Marland Kitchen.. 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    Physical Exam  General:  Well-developed,well-nourished,in no acute distress; alert,appropriate and cooperative throughout examination Psych:  Oriented X3, normally interactive, good eye contact, not suicidal, not homicidal, and subdued.     Impression & Recommendations:  Problem # 1:  INTRAUTERINE CONTRACEPTIVE DEVICE SURVEILLANCE (ICD-V25.42)  Orders: [facility]- Est Level  3 (16109)  Problem # 2:  MENORRHAGIA (ICD-626.2) Assessment: Improved  Her updated medication list for this problem includes:    Ortho-cyclen (28) 0.25-35 Mg-mcg Tabs (Norgestimate-eth estradiol) .Marland Kitchen... 1 by mouth daily  Orders: [facility]- Est Level  3 (56433)  Problem # 3:  DEPRESSION (ICD-311) Assessment: Improved  Her updated medication list for this problem includes:    Trazodone Hcl 100 Mg Tabs (Trazodone hcl) .Marland Kitchen... 1 by mouth at bedtime as needed insomnia.  do not take with  ambien    Doxepin Hcl 10 Mg Caps (Doxepin hcl) .Marland Kitchen... 1 by mouth q hs    Celexa 20 Mg Tabs (Citalopram hydrobromide) .Marland Kitchen... 1/2 tab by mouth daily x 3 days, then 1 tab by mouth daily  Orders: [facility]- Est Level  3 (29518)  Complete Medication List: 1)  Hydrochlorothiazide 25 Mg Tabs (Hydrochlorothiazide) .... Take one (1) by mouth daily 2)  Trazodone Hcl 100 Mg Tabs (Trazodone hcl) .Marland Kitchen.. 1 by mouth at bedtime as needed insomnia.  do not take with ambien 3)  Doxepin Hcl 10 Mg Caps (Doxepin hcl) .Marland Kitchen.. 1 by mouth q hs 4)  Ibuprofen 800 Mg Tabs (Ibuprofen) .... One tab by mouth three times a day x 2 weeks 5)  Celexa 20 Mg Tabs (Citalopram hydrobromide) .... 1/2 tab by mouth daily x 3 days, then 1 tab by mouth daily 6)  Ortho-cyclen (28) 0.25-35 Mg-mcg Tabs (Norgestimate-eth estradiol) .Marland Kitchen.. 1 by mouth daily  Patient Instructions: 1)  Please schedule a follow-up appointment in 2 months.    Orders Added: 1)  [facility]- Est Level  3 [[id number]]

## 2011-01-25 NOTE — Assessment & Plan Note (Signed)
Summary: f/u  kh   Vital Signs:  Patient profile:   30 year old female Height:      63 inches Weight:      207.7 pounds BMI:     36.93 Temp:     98.0 degrees F oral Pulse rate:   88 / minute BP sitting:   138 / 81  (left arm) Cuff size:   regular  Vitals Entered By: Jimmy Footman, CMA (December 19, 2010 8:43 AM) CC: follow up, Depressive symptoms, pelvic pain Is Patient Diabetic? No Pain Assessment Patient in pain? no        CC:  follow up, Depressive symptoms, and pelvic pain.  History of Present Illness:  Depressive symptoms      This is a 30 year old woman who presents with Depressive symptoms.  The patient reports depressed mood, loss of interest/pleasure, insomnia, and psychomotor retardation.  The patient also reports fatigue or loss of energy and diminished concentration.  The patient denies thoughts of suicide, suicidal intent, and suicidal plans.  The patient reports the following psychosocial stressors: recent traumatic event.  The patient denies abnormally elevated mood, abnormally irritable mood, and increased talkativeness.         The patient also complains today of pelvic pain.  The patient presents with LLQ pain and abnormal vaginal bleeding, but denies fever, chills, and vaginal discharge.  Risk factors for pelvic pain/infection include IUD.  This is a cramping type pain and has been here since her IUD was put in.  No relief with Ibuprofen.  Continues to have spotting.  Sheis now considering hysterectomy.  Habits & Providers  Alcohol-Tobacco-Diet     Tobacco Status: current  Current Problems (verified): 1)  Intrauterine Contraceptive Device Surveillance  (ICD-V25.42) 2)  Contraceptive Management  (ICD-V25.09) 3)  Menorrhagia  (ICD-626.2) 4)  Amenorrhea  (ICD-626.0) 5)  Tobacco Abuse  (ICD-305.1) 6)  Dental Caries  (ICD-521.00) 7)  Insomnia-sleep Disorder-unspec  (ICD-780.52) 8)  Depression  (ICD-311) 9)  Pelvic Pain  (ICD-789.09) 10)  Impaired Glucose  Tolerance  (ICD-790.22) 11)  Obesity  (ICD-278.00) 12)  Polycystic Ovary Syndrome  (ICD-256.4) 13)  Hypertension  (ICD-401.1)  Current Medications (verified): 1)  Hydrochlorothiazide 25 Mg Tabs (Hydrochlorothiazide) .... Take One (1) By Mouth Daily 2)  Trazodone Hcl 100 Mg Tabs (Trazodone Hcl) .Marland Kitchen.. 1 By Mouth At Bedtime As Needed Insomnia.  Do Not Take With Ambien 3)  Doxepin Hcl 10 Mg Caps (Doxepin Hcl) .Marland Kitchen.. 1 By Mouth Q Hs 4)  Ibuprofen 800 Mg Tabs (Ibuprofen) .... One Tab By Mouth Three Times A Day X 2 Weeks 5)  Celexa 20 Mg Tabs (Citalopram Hydrobromide) .... 1/2 Tab By Mouth Daily X 3 Days, Then 1 Tab By Mouth Daily 6)  Ortho-Cyclen (28) 0.25-35 Mg-Mcg Tabs (Norgestimate-Eth Estradiol) .Marland Kitchen.. 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    Physical Exam  General:  alert, well-developed, and well-nourished.  Head:  normocephalic and atraumatic.   Eyes:  vision grossly intact.   Mouth:  good dentition.   Neck:  supple.   Chest Wall:  no deformities.   Lungs:  normal respiratory effort and no intercostal retractions.   Heart:  normal rate and regular rhythm.   Abdomen:  soft and non-tender.   Psych:  depressed affect, flat affect, subdued, withdrawn, and  poor eye contact.     Impression & Recommendations:  Problem # 1:  INTRAUTERINE CONTRACEPTIVE DEVICE SURVEILLANCE (ICD-V25.42) Trial of 1 month of OC's to stop spotting and help with cramping. Orders: [facility]- Est  Level 4 (16109)  Problem # 2:  MENORRHAGIA (ICD-626.2)  Orders: [facility]- Est  Level 4 (60454)  The following medications were removed from the medication list:    Ortho-cept (28) 0.15-30 Mg-mcg Tabs (Desogestrel-ethinyl estradiol) .Marland Kitchen... 1  by mouth daily x 1 month Her updated medication list for this problem includes:    Ortho-cyclen (28) 0.25-35 Mg-mcg Tabs (Norgestimate-eth estradiol) .Marland Kitchen... 1 by mouth daily  Problem # 3:  INSOMNIA-SLEEP DISORDER-UNSPEC (ICD-780.52) Increase doxepin to 2 nightly. Orders: [facility]- Est  Level 4 (09811)  Problem # 4:  DEPRESSION (ICD-311) Start Celexa as Paxil did not work and she is clearly not improving. She contracts for safety and is not suicidal. Her updated medication list for this problem includes:    Trazodone Hcl 100 Mg Tabs (Trazodone hcl) .Marland Kitchen... 1 by mouth at bedtime as needed insomnia.  do not take with ambien    Doxepin Hcl 10 Mg Caps (Doxepin hcl) .Marland Kitchen... 1 by mouth q hs    Celexa 20 Mg Tabs (Citalopram hydrobromide) .Marland Kitchen... 1/2 tab by mouth daily x 3 days, then 1 tab by mouth daily  Orders: [facility]- Est  Level 4 (91478)  Problem # 5:  HYPERTENSION (ICD-401.1)  Her updated medication list for this problem includes:    Hydrochlorothiazide 25 Mg Tabs (Hydrochlorothiazide) .Marland Kitchen... Take one (1) by mouth daily  Orders: [facility]- Est  Level 4 (29562)  Complete Medication List: 1)  Hydrochlorothiazide 25 Mg Tabs (Hydrochlorothiazide) .... Take one (1) by mouth daily 2)  Trazodone Hcl 100 Mg Tabs (Trazodone hcl) .Marland Kitchen.. 1 by mouth at bedtime as needed insomnia.  do not take with ambien 3)  Doxepin Hcl 10 Mg Caps (Doxepin hcl) .Marland Kitchen.. 1 by mouth q hs 4)  Ibuprofen 800 Mg Tabs (Ibuprofen) .... One tab by mouth three times a day x 2 weeks 5)  Celexa 20 Mg  Tabs (Citalopram hydrobromide) .... 1/2 tab by mouth daily x 3 days, then 1 tab by mouth daily 6)  Ortho-cyclen (28) 0.25-35 Mg-mcg Tabs (Norgestimate-eth estradiol) .Marland Kitchen.. 1 by mouth daily  Patient Instructions: 1)  Please schedule a follow-up appointment in 1 month.  2)  Printer is messed up and rx could not be sent or printed.  Pt. received only one copy of rx. Prescriptions: ORTHO-CYCLEN (28) 0.25-35 MG-MCG TABS (NORGESTIMATE-ETH ESTRADIOL) 1 by mouth daily  #1 pack x 0   Entered and Authorized by:   Tinnie Gens MD   Signed by:   Tinnie Gens MD on 12/19/2010   Method used:   Print then Give to Patient   RxID:   [id number] CELEXA 20 MG TABS (CITALOPRAM HYDROBROMIDE) 1/2 tab by mouth daily x 3 days, then 1 tab by mouth daily  #30 x 3   Entered and Authorized by:   Tinnie Gens MD   Signed by:   Tinnie Gens MD on 12/19/2010   Method used:  Print then Give to Patient   RxID:   [id number] CELEXA 20 MG TABS (CITALOPRAM HYDROBROMIDE) 1/2 tab by mouth daily x 3 days, then 1 tab by mouth daily  #30 x 3   Entered and Authorized by:   Tinnie Gens MD   Signed by:   Tinnie Gens MD on 12/19/2010   Method used:   Print then Give to Patient   RxID:   [id number] ORTHO-CYCLEN (28) 0.25-35 MG-MCG TABS (NORGESTIMATE-ETH ESTRADIOL) 1 by mouth daily  #1 pack x 0   Entered and Authorized by:   Tinnie Gens MD   Signed by:   Tinnie Gens MD on 12/19/2010   Method used:   Print then Give to Patient   RxID:   [id number] CELEXA 20 MG TABS (CITALOPRAM HYDROBROMIDE) 1/2 tab by mouth daily x 3 days, then 1 tab by mouth daily  #30 x 3   Entered and Authorized by:   Tinnie Gens MD   Signed by:   Tinnie Gens MD on 12/19/2010   Method used:   Print then Give to Patient   RxID:   [id number] ORTHO-CEPT (28) 0.15-30 MG-MCG TABS (DESOGESTREL-ETHINYL ESTRADIOL) 1  by mouth daily x 1 month  #1 pack x 0   Entered and Authorized by:   Tinnie Gens MD   Signed by:   Tinnie Gens MD on 12/19/2010    Method used:   Print then Give to Patient   RxID:   [id number] CELEXA 20 MG TABS (CITALOPRAM HYDROBROMIDE) 1/2 tab by mouth daily x 3 days, then 1 tab by mouth daily  #30 x 3   Entered and Authorized by:   Tinnie Gens MD   Signed by:   Tinnie Gens MD on 12/19/2010   Method used:   Print then Give to Patient   RxID:   [id number] ORTHO-CEPT (28) 0.15-30 MG-MCG TABS (DESOGESTREL-ETHINYL ESTRADIOL) 1  by mouth daily x 1 month  #1 pack x 0   Entered and Authorized by:   Tinnie Gens MD   Signed by:   Tinnie Gens MD on 12/19/2010   Method used:   Print then Give to Patient   RxID:   [id number]    Orders Added: 1)  [facility]- Est  Level 4 [76283]

## 2011-01-25 NOTE — Progress Notes (Signed)
Summary: Rx  Phone Note Call from Patient Call back at Home Phone [phone]   Reason for Call: Talk to Doctor Summary of Call: pt still cramping & bleeding, ibuprofen is not helping, would like something stronger.  Initial call taken by: Knox Royalty,  December 29, 2010 10:30 AM  Follow-up for Phone Call        See if pt. is interested in 1 pack of OC's to stop bleeding and cramping. T  Additional Follow-up for Phone Call Additional follow up Details #1::        Pt was given OC's at her last visit with you. Additional Follow-up by: Jone Baseman CMA,  January 01, 2011 11:11 AM    Additional Follow-up for Phone Call Additional follow up Details #2::    Called pt.  Discussed alternatives....Marland Kitchentrial of 2x/day OC's, skip placebo---trial of pamprin, midol, etc.  If all else fails consider coming in for IUD removal. Follow-up by: Tinnie Gens MD,  January 01, 2011 1:46 PM

## 2011-03-08 LAB — WET PREP, GENITAL
Trich, Wet Prep: NONE SEEN
Yeast Wet Prep HPF POC: NONE SEEN

## 2011-03-11 LAB — POCT URINALYSIS DIP (DEVICE)
Bilirubin Urine: NEGATIVE
Ketones, ur: NEGATIVE mg/dL
Protein, ur: NEGATIVE mg/dL
Specific Gravity, Urine: 1.02 (ref 1.005–1.030)
Urobilinogen, UA: 0.2 mg/dL (ref 0.0–1.0)
pH: 6 (ref 5.0–8.0)

## 2011-03-13 LAB — CBC
Hemoglobin: 11.7 g/dL — ABNORMAL LOW (ref 12.0–15.0)
MCHC: 33.6 g/dL (ref 30.0–36.0)
RBC: 3.69 MIL/uL — ABNORMAL LOW (ref 3.87–5.11)
WBC: 9.4 10*3/uL (ref 4.0–10.5)

## 2011-03-13 LAB — POCT PREGNANCY, URINE: Preg Test, Ur: NEGATIVE

## 2011-03-13 LAB — URINALYSIS, ROUTINE W REFLEX MICROSCOPIC
Nitrite: POSITIVE — AB
Specific Gravity, Urine: 1.025 (ref 1.005–1.030)
Urobilinogen, UA: 1 mg/dL (ref 0.0–1.0)
pH: 6.5 (ref 5.0–8.0)

## 2011-03-13 LAB — URINE MICROSCOPIC-ADD ON

## 2011-03-13 LAB — URINE CULTURE

## 2011-03-29 LAB — DIFFERENTIAL
Basophils Relative: 1 % (ref 0–1)
Lymphs Abs: 2.6 10*3/uL (ref 0.7–4.0)
Monocytes Relative: 7 % (ref 3–12)
Neutro Abs: 5.4 10*3/uL (ref 1.7–7.7)
Neutrophils Relative %: 62 % (ref 43–77)

## 2011-03-29 LAB — URINALYSIS, ROUTINE W REFLEX MICROSCOPIC
Specific Gravity, Urine: 1.018 (ref 1.005–1.030)
Urobilinogen, UA: 0.2 mg/dL (ref 0.0–1.0)

## 2011-03-29 LAB — BASIC METABOLIC PANEL
CO2: 29 mEq/L (ref 19–32)
Calcium: 9.6 mg/dL (ref 8.4–10.5)
Chloride: 100 mEq/L (ref 96–112)
Creatinine, Ser: 0.77 mg/dL (ref 0.4–1.2)
GFR calc Af Amer: >60 mL/min (ref >60)
Sodium: 135 mEq/L (ref 135–145)

## 2011-03-29 LAB — CBC
MCHC: 33.4 g/dL (ref 30.0–36.0)
MCV: 91.9 fL (ref 78.0–100.0)
Platelets: 294 10*3/uL (ref 150–400)
RDW: 13.6 % (ref 11.5–15.5)
WBC: 8.7 10*3/uL (ref 4.0–10.5)

## 2011-03-29 LAB — PREGNANCY, URINE: Preg Test, Ur: NEGATIVE

## 2011-03-29 LAB — URINE MICROSCOPIC-ADD ON

## 2011-03-29 LAB — GC/CHLAMYDIA PROBE AMP, GENITAL: Chlamydia, DNA Probe: NEGATIVE

## 2011-03-29 LAB — WET PREP, GENITAL

## 2011-03-30 LAB — POCT PREGNANCY, URINE: Preg Test, Ur: NEGATIVE

## 2011-04-09 LAB — URINALYSIS, ROUTINE W REFLEX MICROSCOPIC
Glucose, UA: NEGATIVE mg/dL
Nitrite: NEGATIVE
Protein, ur: NEGATIVE mg/dL
Urobilinogen, UA: 1 mg/dL (ref 0.0–1.0)

## 2011-04-09 LAB — URINE MICROSCOPIC-ADD ON

## 2011-04-27 ENCOUNTER — Ambulatory Visit (INDEPENDENT_AMBULATORY_CARE_PROVIDER_SITE_OTHER): Payer: Self-pay | Admitting: Family Medicine

## 2011-04-27 ENCOUNTER — Encounter: Payer: Self-pay | Admitting: Family Medicine

## 2011-04-27 VITALS — BP 129/82 | HR 92 | Temp 98.0°F | Wt 206.8 lb

## 2011-04-27 DIAGNOSIS — F329 Major depressive disorder, single episode, unspecified: Secondary | ICD-10-CM

## 2011-04-27 DIAGNOSIS — I10 Essential (primary) hypertension: Secondary | ICD-10-CM

## 2011-04-27 DIAGNOSIS — R109 Unspecified abdominal pain: Secondary | ICD-10-CM

## 2011-04-27 NOTE — Progress Notes (Signed)
  Subjective:    Patient ID: Sandra Pittman, female    DOB: [birth year], 30 y.o.   MRN: [id number]  HPI Comments: IUD is causing a lot of cramping and irregular spotting which has not stopped for many months. She is unsure about other contraception.  Desires to conceive. Many family members are still dying or getting bad diagnoses.     Review of Systems  Constitutional: Negative for appetite change.  Respiratory: Negative for shortness of breath.   Gastrointestinal: Negative for abdominal pain.  Genitourinary: Positive for vaginal bleeding, menstrual problem and pelvic pain.       Objective:   Physical Exam  Constitutional: She is oriented to person, place, and time. She appears well-developed and well-nourished.  HENT:  Head: Normocephalic and atraumatic.  Cardiovascular: Normal rate and regular rhythm.   Pulmonary/Chest: Effort normal and breath sounds normal.  Abdominal: Soft.  Genitourinary:       Procedure:  IUD strings grasped with ring forceps and removed easily.  Neurological: She is alert and oriented to person, place, and time.  Psychiatric: She has a normal mood and affect.          Assessment & Plan:

## 2011-04-27 NOTE — Assessment & Plan Note (Signed)
IUD removed

## 2011-04-27 NOTE — Assessment & Plan Note (Signed)
Feels some better!

## 2011-04-27 NOTE — Assessment & Plan Note (Signed)
Continue current medications. 

## 2011-04-27 NOTE — Patient Instructions (Signed)
Preparing for Pregnancy Preparing for pregnancy (preconceptual care) by getting counseling and information from your caregiver before getting pregnant is a good idea. It will help you and your baby have a better chance to have a healthy, safe pregnancy and delivery of your baby. Make an appointment with your caregiver to talk about your health, medical and family history and how to prepare yourself before getting pregnant. Your caregiver will do a complete physical exam and a Pap test. They will want to know:  About you, your spouse/partner and your family's medical and genetic history.   If you are eating a balanced diet and drinking enough fluids.   What vitamins and mineral supplements you are taking. This includes taking folic acid before getting pregnant to help prevent birth defects.   What medications you are taking including prescription, over-the-counter and herbal medications.   If there is any substance abuse like alcohol, smoking and illegal drugs.   If there is any mental or physical domestic violence.   If there is any risk of sexually transmitted disease between you and your partner.   What immunizations and vaccinations you have had and what you may need before getting pregnant.   If you should get tested for HIV infection.   If there is any exposure to chemical or toxic substances at home or work.   If there are medical problems you have that need to be treated and kept under control before getting pregnant such as diabetes, high blood pressure or others.   If there were any past surgeries, pregnancies and problems with them.   What your current weight is and to set a goal as to how much weight you should gain while pregnant. Also, they will check if you should lose or gain weight before getting pregnant.   What is your exercise routine and what it is safe when you are pregnant.   If there are any physical disabilities that need to be addressed.   About spacing your  pregnancies when there are other children.   If there is a financial problem that may affect you having a child.  After talking about the above points with your caregiver, your caregiver will give you advice on how to help treat and work with you on solving any issues, if necessary, before getting pregnant. The goal is to have a healthy and safe pregnancy for you and your baby. You should keep an accurate record of your menstrual periods because it will help in determining your due date. Immunizations that you should have before getting pregnant:   Regular measles, [place] measles (rubella) and mumps.  Tetanus and diphtheria.   Chicken pox, if not immune.   Herpes zoster (Varicella) if not immune.  Human papilloma virus vaccine (HPV) between the age of 53 and 50 years old).   Hepatitis A vaccine.  Hepatitis B vaccine.   Influenza vaccine.   Pneumococcal vaccine (pneumonia).   You should avoid getting pregnant for one month after getting vaccinated with a live virus vaccine such as [place] measles (rubella) vaccine. Other immunizations may be necessary depending on where you live, such as malaria. Ask your caregiver if any other immunizations are needed for you. HOME CARE INSTRUCTIONS  Follow the advice of your caregiver.   Before getting pregnant:   Begin taking vitamins, supplements and folic acid 0.4 milligrams/day.   Get your immunizations up to date.   Get help from a nutrition counselor if you do not understand what a balanced diet  is, need help with a special medical diet or if you need help to lose or gain weight.   Begin exercising.   Stop smoking, taking illegal drugs and drinking alcoholic beverages.   Get counseling if there is and type of domestic violence.   Get checked for sexually transmitted diseases including HIV.   Get any medical problems under control (diabetes, high blood pressure, convulsions, asthma or others).   Resolve any financial concerns.   Be  sure you and your spouse/partner are ready to have a baby.   Keep an accurate record of your menstrual periods.  Document Released: 11/22/2008  [facility] Patient Information 2011 [place], [place].Birth Control Choices Birth control is the use of any practices, methods, or devices to prevent pregnancy from happening in a sexually active woman.  Below are some birth control choices to help avoid pregnancy.  Not having sex (abstinence) is the surest form of birth control. This requires self-control. There is no risk of acquiring a sexually transmitted disease (STD), including acquired immunodeficiency syndrome (AIDS).   Periodic abstinence requires self-control during certain times of the month.   Calendar method, timing your menstrual periods from month to month.   Ovulation method is avoiding sexual intercourse around the time you produce an egg (ovulate).   Symptotherm method is avoiding sexual intercourse at the time of ovulation, using a thermometer and ovulation symptoms.   Post ovulation method is the timing of sexual intercourse after you ovulated.  These methods do not protect against STDs, including AIDS.  Birth control pills (BCPs) contain estrogen and progesterone hormone. These medicines work by stopping the egg from forming in the ovary (ovulation). Birth control pills are prescribed by a caregiver who will ask you questions about the risks of taking BCPs. Birth control pills do not protect against STDs, including AIDS.   "Minipill" birth control pills have only the progesterone hormone. They are taken every day of each month and must be prescribed by your caregiver. They do not protect against STDs, including AIDS.   Emergency contraception is often call the "morning after" pill. This pill can be taken right after sex or up to five days after sex if you think your birth control failed, you failed to use contraception, or you were forced to have sex. It is most effective the sooner  you take the pills after having sexual intercourse. Do not use emergency contraception as your only form of birth control. Emergency contraceptive pills are available without a prescription. Check with your pharmacist.   Condoms are a thin sheath of latex, synthetic material, or lambskin worn over the penis during sexual intercourse. They can have a spermicide in or on them when you buy them. Latex condoms can prevent pregnancy and STDs. "Natural" or lambskin condoms can prevent pregnancy but may not protect against STDs, including AIDS.   Female condoms are a soft, loose-fitting sheath that is put into the vagina before sexual intercourse. They can prevent pregnancy and STDs, including AIDS.   Sponge is a soft, circular piece of polyurethane foam with spermicide in it that is inserted into the vagina after wetting it and before sexual intercourse. It does not require a prescription from your caregiver. It does not protect against STDs, including AIDS.   Diaphragm is a soft, latex, dome-shaped barrier that must be fitted by a caregiver. It is inserted into the vagina, along with a spermicidal jelly. After the proper fitting for a diaphragm, always insert the diaphragm before intercourse. The diaphragm should  be left in the vagina for 6 to 8 hours after intercourse. Removal and reinsertion with a spermicide is always necessary after any use. It does not protect against STDs, including AIDS.   Progesterone-only injections are given every 3 months to prevent pregnancy. These injections contain synthetic progesterone and no estrogen. This hormone stops the ovaries from releasing eggs. It also causes the cervical mucus to thicken and changes the uterine lining. This makes it harder for sperm to survive in the uterus. It does not protect against STDs, including AIDS.   Birth Control Patch contains hormones similar to those in birth control pills, so effectiveness, risks, and side effects are similar. It must  be changed once a week and is prescribed by a caregiver. It is less effective in very overweight women. It does not protect against STDs, including AIDS.   Vaginal Ring contains hormones similar to those in birth control pills. It is left in place for 3 weeks, removed for 1 week, and then a new one is put back into the vagina. It comes with a timer to put in your purse to help you remember when to take it out or put a new one in. A caregiver's examination and prescription is necessary, just like with birth control pills and the patch. It does not protect against STDs, including AIDS.   Estrogen plus progesterone injections are given every 28 to 30 days. They can be given in the upper arm, thigh, or buttocks. It does not protect against STDs, including AIDS.   Intrauterine device (IUD): copper T or progestin filled is a T-shaped device that is put in a woman's uterus during a menstrual period to prevent pregnancy. The copper T IUD can last 10 years, and the progestin IUD can last 5 years. The progestin IUD can also help control heavy menstrual periods. It does not protect against STDs, including AIDS. The copper T IUD can be used as emergency contraception if inserted within 5 days of having unprotected intercourse.   Cervical cap is a round, soft latex or plastic cup that fits over the cervix and must be fitted by a caregiver. You do not need to use a spermicide with it or remove and insert it every time you have sexual intercourse. It does not protect against STDs, including AIDS.   Spermicides are chemicals that kill or block sperm from entering the cervix and uterus. They come in the form of creams, jellies, suppositories, foam, or tablets, and they do not require a prescription. They are inserted into the vagina with an applicator before having sexual intercourse. This must be repeated every time you have sexual intercourse.   Withdrawal is using the method of the female withdrawing his penis from  sexual intercourse before he has a climax and deposits his sperm. It does not protect against STDs, including AIDS.   Female tubal ligation is when the woman's fallopian tubes are surgically sealed or tied to prevent the egg from traveling to the uterus. It does not protect against STDs, including AIDS.   Female sterilization is when the female has his tubes that carry sperm tied off (vasectomy) to stop sperm from entering the vagina during sexual intercourse. It does not protect against STDs, including AIDS.  Regardless of which method of birth control you choose, it is still important that you use some form of protection against STDs. Document Released: 12/10/2005 Document Re-Released: 05/30/2010 [facility] Patient Information 2011 [place], [place].

## 2011-05-08 NOTE — Op Note (Signed)
NAMEADHYA, Sandra Pittman                 ACCOUNT NO.:  [id number]   MEDICAL RECORD NO.:  [id number]          PATIENT TYPE:  AMB   LOCATION:  [facility]                           [facility]:  [facility]   PHYSICIAN:  Tanya S. Shawnie Pons, M.D.   DATE OF BIRTH:  17-Nov-1981   DATE OF PROCEDURE:  04/25/2007  DATE OF DISCHARGE:                               OPERATIVE REPORT   PREOPERATIVE DIAGNOSIS:  Pelvic pain.   POSTOPERATIVE DIAGNOSIS:  Pelvic pain.   PROCEDURE:  Diagnostic laparoscopy.   SURGEON:  Shelbie Proctor. Shawnie Pons, M.D.   ASSISTANT:  None.   ANESTHESIA:  General and local.  Dr. Malen Gauze.   SPECIMENS:  None.   ESTIMATED BLOOD LOSS:  Minimal.   COMPLICATIONS:  None immediately known.   FINDINGS:  Normal pelvis, normal uterus, normal tubes, normal ovaries,  normal appendix, normal liver.   REASON FOR PROCEDURE:  Briefly the patient is a 30 year old nulligravida  who had some atypical pelvic pain.  It was lower abdomen radiating to  her back.  However it was not really related to her cycles, nor did it  respond to continuous NuvaRing.  The pain was intermittent and came for  approximately 2-1/2 weeks out of the month.  This procedure was to rule  out any pelvic pathology.  The patient had previously had a normal  ultrasound.   PROCEDURE:  Patient was taken to the OR.  She was placed in dorsal  lithotomy in [place] stirrups.  She was prepped and draped in the usual  sterile fashion.  A Foley was inserted into her bladder.  A speculum was  used to visualize the cervix with was grasped anteriorly with a single-  tooth tenaculum and a Hulka tenaculum placed through the cervix and into  the uterus for manipulation.  The single-tooth tenaculum and speculum  were removed from the vagina.   Attention was then turned to the abdomen where 6 mL or 0.25% Marcaine  were injected into the umbilicus.  An incision was made through the  umbilicus to the underlying peritoneum cavity under direct  visualization.  Two  edges of the fascia were tied with 0 Vicryl on a UR-  6 and Hassan trocar placed through the incision.  A pneumoperitoneum was  created and the patient was placed in steep Trendelenburg. The  laparoscope was then placed through this trocar and the pelvis was  inspected.  The patient's anterior and posterior cul-de-sacs were  viewed, felt to be normal and photographed.  The uterus, tubes and  ovaries were also reviewed, felt to be normal and photographed.  The  appendix was reviewed, looked to be normal, photographed.  Pelvic  sidewalls were reviewed, looked to be normal.  The liver edges were  looked at and felt to be normal.  Overall she had a very normal looking  pelvis.  At this point the procedure was discontinued.  All instruments  were removed from the abdomen.  The 2 previously mentioned Vicryl  sutures were used to close the remainder of the fascia in 2 figure-of-  eights.  The skin was closed  with a 3-0 Vicryl in a running subcuticular  fashion.  A fresh dressing was placed.  The Hulka tenaculum was removed  from the cervix.  The Foley was also removed and a new NuvaRing was  placed for the patient.  All instrument, needle and lap counts were  correct x2.  The patient was awakened and taken to recovery room in  stable condition.           ______________________________  Shelbie Proctor Shawnie Pons, M.D.     TSP/MEDQ  D:  04/25/2007  T:  04/25/2007  Job:  [id number]

## 2011-05-08 NOTE — Group Therapy Note (Signed)
NAME:  Sandra Pittman, Sandra Pittman NO.:  [id number]   MEDICAL RECORD NO.:  [id number]          PATIENT TYPE:  WOC   LOCATION:  [facility]                   FACILITY:  [facility]   PHYSICIAN:  Dorthula Perfect, MD     DATE OF BIRTH:  1981/07/25   DATE OF SERVICE:                                  CLINIC NOTE   This is a 30 year old African American female, who [facility] and infertility.  She  already has an appointment on September 02, 2009 for evaluation of PCOS  and infertility.  She has never been pregnant.  Couple of years ago, she  had regular periods.  So for this year, she has had 1 period on June 07, 2009, which lasted 5 days and another period on June 03, 2009, which was  induced by Provera.  Menstrual flow was somewhat heavy and she does have  cramping.  In the past, she had a diagnostic laparoscopy done on May  2008, which was normal.  In 2007, she had a normal ultrasound.  She is 5  feet 3, weight is 211 pounds with a blood pressure of 129/92.  I  discussed with the patient I thought since the treatment for PCOS and/or  infertility will be necessary for her that she keep that August 26, 2009 appointment.  I explained her that any evaluation for infertility  in a special circumstance here in the clinic.  I told her I did not  think I need to examine her today.  I told her that I would mark her  chart slip has no charge and no exam.  She accepts this.           ______________________________  Dorthula Perfect, MD     ER/MEDQ  D:  07/14/2009  T:  07/15/2009  Job:  [id number]

## 2011-05-11 NOTE — Group Therapy Note (Signed)
NAMEPHENIX, GREIN NO.:  [id number]   MEDICAL RECORD NO.:  [id number]          PATIENT TYPE:  WOC   LOCATION:  [facility]                   FACILITY:  [facility]   PHYSICIAN:  Tinnie Gens, MD        DATE OF BIRTH:  [birth year]   DATE OF SERVICE:                                  CLINIC NOTE   CHIEF COMPLAINT:  Pelvic pain.   HISTORY OF PRESENT ILLNESS:  The patient is a 30 year old nulligravida  who was on Nuvaring continuously because of pelvic pain.  The patient  reports the pain is approximately 2-1/2 weeks every month.  It comes and  goes, stabbing and cramping in nature.  It is not worse related to her  cycles because she is not having cycles, but it does feel like her  periods are coming.  Despite the fact that she is not having cycles, she  complains of always feeling tired.  She is working out and does not eat  much, but has difficulty losing weight.  The pain is in her lower  abdomen and radiates to her back at times.  She has previously been seen  at [facility].  At that time, she was examined.  There was no  significant nodularity or tenderness noted.  Everything felt fairly  normal.  However, we did get a pelvic ultrasound and considered  laparoscopy at that time.  She underwent pelvic sonography on 12/12/2006  which revealed normal ovaries, normal uterus.   PAST MEDICAL HISTORY:  Negative.   PAST SURGICAL HISTORY:  Wisdom teeth and pilonidal cyst.   MEDICATIONS:  Nuvaring.   ALLERGIES:  None known.   OBSTETRICAL HISTORY:  G 0.   GYNECOLOGY HISTORY:  Menarche at age 30.  Cycles are every 28 days,  lasts 3-4 days if she is having them.  She is not using the __________ .  She does use condoms.   FAMILY HISTORY:  Diabetes, hypertension, cancer.   SOCIAL HISTORY:  She works for [employer].  No tobacco, alcohol  or drug use.   REVIEW OF SYSTEMS:  Reviewed.  Please see GYN history on the chart.  She  does have fatigue, weight gain and  vaginal odor at times.  The patient  underwent GC and chlamydia testing in 11/2006 which were negative.  She  also had a RPR that was negative, wet prep that was negative, normal  glucose and a normal Pap smear.   PHYSICAL EXAMINATION:  VITAL SIGNS:  Blood pressure is 121/79, weight  183, height 5 feet 2 inches, pulse 93.  GENERAL:  She is a well-nourished female in no acute distress.  NECK:  Supple.  Normal thyroid.  LUNGS:  Clear bilaterally.  CV:  Regular rate and rhythm.  No murmurs, rubs or gallops.  ABDOMEN:  Soft, nontender, nondistended.  GU:  Normal external female genitalia.  BUS is normal.  Vagina is pink  and rugated.  Cervix is nulliparous without lesions.  Uterus is small,  anteverted.  No adnexal mass or tenderness.  The uterus is not  particularly tender.  Again, there is no  nodularity noted.   IMPRESSION:  Atypical pelvic pain and unclear etiology.  Could be  interstitial cystitis.   PLAN:  Diagnostic laparoscopy.           ______________________________  Tinnie Gens, MD     TP/MEDQ  D:  04/11/2007  T:  04/11/2007  Job:  [id number]

## 2011-07-17 ENCOUNTER — Telehealth: Payer: Self-pay | Admitting: Family Medicine

## 2011-07-17 NOTE — Telephone Encounter (Signed)
Pt states that she has been bleeding for 3 weeks, having to change her pads/tampons every hour.  Advised that Dr. Shawnie Pons is on vacation this week, but expressed with that much bleeding she needed to be seen.  Pt agreeable as long as it is with a female provider.  Appt scheduled with Dr. Hulen Luster for this Friday @ 9:30am Sandra Pittman, [name]

## 2011-07-17 NOTE — Telephone Encounter (Signed)
Has been bleeding for 3 weeks and is wanting something called in to stop the bleeding and also something for pain.  She has had this done in the past and was told all she had to do was call Dr Shawnie Pons. [facility] [address]

## 2011-07-20 ENCOUNTER — Ambulatory Visit (INDEPENDENT_AMBULATORY_CARE_PROVIDER_SITE_OTHER): Payer: Self-pay | Admitting: Family Medicine

## 2011-07-20 ENCOUNTER — Encounter: Payer: Self-pay | Admitting: Family Medicine

## 2011-07-20 VITALS — BP 130/77 | HR 94 | Temp 98.0°F | Wt 206.0 lb

## 2011-07-20 DIAGNOSIS — R109 Unspecified abdominal pain: Secondary | ICD-10-CM

## 2011-07-20 DIAGNOSIS — N920 Excessive and frequent menstruation with regular cycle: Secondary | ICD-10-CM

## 2011-07-20 MED ORDER — MEDROXYPROGESTERONE ACETATE 5 MG PO TABS: 5.0000 mg | ORAL_TABLET | Freq: Every day | ORAL | Status: DC

## 2011-07-20 NOTE — Progress Notes (Signed)
  Subjective:    Patient ID: Sandra Pittman, female    DOB: 1981-07-07, 30 y.o.   MRN: [id number]  HPI Heavy bleeding x 3 weeks and intermittent LLQ pain.  No period since IUD out before this.  Previous w/u includes laporoscopy that was WNL, recent u/s WNL.  Has tried mult OCPs, the IUD and Nuvaring.  Bleeding and cramping continued.  Has tried NSAIDs and tramadol for pain which didn't help.  BMs are soft and daily.  No fevers, no N/V/D or weight loss   Review of Systems    Denies CP, SOB, HA, N/V/D, fever  Objective:   Physical Exam   Vital signs reviewed General appearance - alert, well appearing, and in no distress and oriented to person, place, and time Heart - normal rate, regular rhythm, normal S1, S2, no murmurs, rubs, clicks or gallops Chest - clear to auscultation, no wheezes, rales or rhonchi, symmetric air entry, no tachypnea, retractions or cyanosis Abdomen - soft,  nondistended, no masses or organomegaly  Mild TTP LLQ      Assessment & Plan:   PELVIC  PAIN Continued.  Constant.  Not better with NSAIDs.  Recent U/s 4/11 WNL.  BMs normal and q day.  No fevers or red flags.    Menorrhagia No periods since May when IUD removed now 3 weeks of bleeding.  Will try Dr. Tawni Levy plan from before IUD.  Provera every other month for 5 days.

## 2011-07-20 NOTE — Patient Instructions (Signed)
Take 5 days of the provera now to stop the bleeding.  You may have a cycle when you stop the medicine  IN 2 months, take 5 days again starting on the 15th of the month  You can do this on odd numbered months  Come back in 2-3 weeks to see Dr. Shawnie Pons and check in  You can talk to her about your pain

## 2011-07-20 NOTE — Assessment & Plan Note (Signed)
No periods since May when IUD removed now 3 weeks of bleeding.  Will try Dr. Tawni Levy plan from before IUD.  Provera every other month for 5 days.

## 2011-07-20 NOTE — Assessment & Plan Note (Signed)
Continued.  Constant.  Not better with NSAIDs.  Recent U/s 4/11 WNL.  BMs normal and q day.  No fevers or red flags.

## 2011-07-24 ENCOUNTER — Ambulatory Visit: Payer: Self-pay | Admitting: Family Medicine

## 2011-07-27 ENCOUNTER — Inpatient Hospital Stay (INDEPENDENT_AMBULATORY_CARE_PROVIDER_SITE_OTHER)
Admission: RE | Admit: 2011-07-27 | Discharge: 2011-07-27 | Disposition: A | Discharge status: Home / Self Care | Payer: Self-pay | Source: Ambulatory Visit | Attending: Family Medicine | Admitting: Family Medicine

## 2011-07-27 DIAGNOSIS — R071 Chest pain on breathing: Secondary | ICD-10-CM

## 2011-09-25 LAB — POCT PREGNANCY, URINE: Preg Test, Ur: NEGATIVE

## 2011-10-11 LAB — URINE MICROSCOPIC-ADD ON

## 2011-10-11 LAB — URINALYSIS, ROUTINE W REFLEX MICROSCOPIC
Nitrite: NEGATIVE
Protein, ur: NEGATIVE
Specific Gravity, Urine: >1.03 — ABNORMAL HIGH
Urobilinogen, UA: 1

## 2011-10-11 LAB — POCT PREGNANCY, URINE
Operator id: 220991
Preg Test, Ur: NEGATIVE

## 2012-01-22 ENCOUNTER — Other Ambulatory Visit (HOSPITAL_COMMUNITY)
Admission: RE | Admit: 2012-01-22 | Discharge: 2012-01-22 | Disposition: A | Discharge status: Home / Self Care | Payer: Self-pay | Source: Ambulatory Visit | Attending: Family Medicine | Admitting: Family Medicine

## 2012-01-22 ENCOUNTER — Ambulatory Visit (INDEPENDENT_AMBULATORY_CARE_PROVIDER_SITE_OTHER): Payer: Self-pay | Admitting: Family Medicine

## 2012-01-22 ENCOUNTER — Encounter: Payer: Self-pay | Admitting: Family Medicine

## 2012-01-22 VITALS — BP 120/80 | HR 76 | Temp 98.4°F | Ht 62.5 in | Wt 212.1 lb

## 2012-01-22 DIAGNOSIS — F3289 Other specified depressive episodes: Secondary | ICD-10-CM

## 2012-01-22 DIAGNOSIS — E282 Polycystic ovarian syndrome: Secondary | ICD-10-CM

## 2012-01-22 DIAGNOSIS — N912 Amenorrhea, unspecified: Secondary | ICD-10-CM

## 2012-01-22 DIAGNOSIS — Z124 Encounter for screening for malignant neoplasm of cervix: Secondary | ICD-10-CM

## 2012-01-22 DIAGNOSIS — E669 Obesity, unspecified: Secondary | ICD-10-CM

## 2012-01-22 DIAGNOSIS — F17200 Nicotine dependence, unspecified, uncomplicated: Secondary | ICD-10-CM

## 2012-01-22 DIAGNOSIS — F329 Major depressive disorder, single episode, unspecified: Secondary | ICD-10-CM

## 2012-01-22 DIAGNOSIS — K029 Dental caries, unspecified: Secondary | ICD-10-CM

## 2012-01-22 DIAGNOSIS — I10 Essential (primary) hypertension: Secondary | ICD-10-CM

## 2012-01-22 DIAGNOSIS — Z01419 Encounter for gynecological examination (general) (routine) without abnormal findings: Secondary | ICD-10-CM | POA: Insufficient documentation

## 2012-01-22 DIAGNOSIS — N920 Excessive and frequent menstruation with regular cycle: Secondary | ICD-10-CM

## 2012-01-22 DIAGNOSIS — Z23 Encounter for immunization: Secondary | ICD-10-CM

## 2012-01-22 DIAGNOSIS — G4700 Insomnia, unspecified: Secondary | ICD-10-CM

## 2012-01-22 DIAGNOSIS — R109 Unspecified abdominal pain: Secondary | ICD-10-CM

## 2012-01-22 DIAGNOSIS — R7309 Other abnormal glucose: Secondary | ICD-10-CM

## 2012-01-22 LAB — LIPID PANEL
HDL: 46 mg/dL (ref >39)
Total CHOL/HDL Ratio: 3.5 Ratio
Triglycerides: 110 mg/dL (ref <150)

## 2012-01-22 LAB — CBC
MCV: 95.2 fL (ref 78.0–100.0)
Platelets: 343 10*3/uL (ref 150–400)
RBC: 4.19 MIL/uL (ref 3.87–5.11)
RDW: 14.6 % (ref 11.5–15.5)
WBC: 10.2 10*3/uL (ref 4.0–10.5)

## 2012-01-22 LAB — COMPREHENSIVE METABOLIC PANEL
Albumin: 4.1 g/dL (ref 3.5–5.2)
Alkaline Phosphatase: 71 U/L (ref 39–117)
BUN: 8 mg/dL (ref 6–23)
CO2: 25 mEq/L (ref 19–32)
Glucose, Bld: 97 mg/dL (ref 70–99)
Potassium: 4.1 mEq/L (ref 3.5–5.3)
Total Bilirubin: 0.3 mg/dL (ref 0.3–1.2)

## 2012-01-22 MED ORDER — MEDROXYPROGESTERONE ACETATE 5 MG PO TABS: 5.0000 mg | ORAL_TABLET | Freq: Every day | ORAL | Status: DC

## 2012-01-22 MED ORDER — HYDROCHLOROTHIAZIDE 25 MG PO TABS: 25.0000 mg | ORAL_TABLET | Freq: Every day | ORAL | Status: DC

## 2012-01-22 NOTE — Progress Notes (Signed)
Subjective:    Patient ID: Sandra Pittman, female    DOB: Jun 18, 1981, 31 y.o.   MRN: [id number]  HPI  Still having left sided neck and shoulder pain.  NSAIDS are not helping. Trying heat and chiropractor.  Will return to him.  Having painful periods.  Worse since stopping IUD.  Having regular cycle.  Condoms for birth control.  Desires pregnancy this year.  Discussed need for Prenatal vits.  Desires flu and TdaP today.  Complains of insomnia despite trial of Ambien and Doxepin.  No longer taking Celexa and does not want to restart this. Patient Active Problem List  Diagnoses  . POLYCYSTIC OVARY SYNDROME  . OBESITY  . TOBACCO ABUSE  . DEPRESSION  . HYPERTENSION  . DENTAL CARIES  . AMENORRHEA  . INSOMNIA-SLEEP DISORDER-UNSPEC  . PELVIC  PAIN  . IMPAIRED GLUCOSE TOLERANCE  . Menorrhagia   Past Surgical History  Procedure Date  . Diagnostic laparoscopy   . Wisdom tooth extraction    History   Social History  . Marital Status: Single    Spouse Name: N/A    Number of Children: N/A  . Years of Education: N/A   Social History Main Topics  . Smoking status: Current Everyday Smoker -- .3 years    Types: Cigarettes, Cigars  . Smokeless tobacco: Never Used  . Alcohol Use: None  . Drug Use: None  . Sexually Active: None   Other Topics Concern  . None   Social History Narrative  . None   Family History  Problem Relation Age of Onset  . Diabetes Mother        Review of Systems  Constitutional: Negative for fever and chills.       Insomnia  HENT: Positive for neck pain. Negative for congestion.   Eyes: Negative for visual disturbance.  Cardiovascular: Negative for leg swelling.  Gastrointestinal: Negative for nausea, abdominal pain and diarrhea.  Genitourinary: Negative for dysuria, hematuria and menstrual problem.  Musculoskeletal:       Shoulder pain  Skin: Negative for rash.  Neurological: Negative for headaches.  Psychiatric/Behavioral: Negative for dysphoric mood  and decreased concentration.       Objective:   Physical Exam  Vitals reviewed. Constitutional: She appears well-developed and well-nourished.  HENT:  Head: Normocephalic and atraumatic.  Mouth/Throat: Oropharynx is clear and moist.  Eyes: No scleral icterus.  Neck: Normal range of motion. Neck supple. No thyromegaly present.  Cardiovascular: Normal rate and regular rhythm.   Pulmonary/Chest: Effort normal and breath sounds normal. Right breast exhibits no inverted nipple, no mass, no nipple discharge and no skin change. Left breast exhibits no inverted nipple, no mass, no nipple discharge and no skin change.  Abdominal: Soft. She exhibits no mass. There is no tenderness. There is no guarding.  Genitourinary: Vagina normal and uterus normal. There is no tenderness or lesion on the right labia. There is no tenderness or lesion on the left labia. Uterus is not tender. Cervix exhibits no motion tenderness and no discharge. Right adnexum displays no mass and no tenderness. Left adnexum displays no mass and no tenderness. No vaginal discharge found.  Lymphadenopathy:    She has no cervical adenopathy.          Assessment & Plan:   1. Screening for malignant neoplasm of the cervix  Cytology - PAP  2. AMENORRHEA  medroxyPROGESTERone (PROVERA) 5 MG tablet  3. DENTAL CARIES    4. DEPRESSION    5. HYPERTENSION  Comprehensive metabolic panel,  Lipid panel, hydrochlorothiazide (HYDRODIURIL) 25 MG tablet  6. IMPAIRED GLUCOSE TOLERANCE    7. INSOMNIA-SLEEP DISORDER-UNSPEC    8. OBESITY    9. PELVIC  PAIN    10. POLYCYSTIC OVARY SYNDROME    11. TOBACCO ABUSE    12. Routine gynecological examination  TSH, CBC  13. Menorrhagia  medroxyPROGESTERone (PROVERA) 5 MG tablet

## 2012-01-22 NOTE — Assessment & Plan Note (Signed)
Have tried [place] and doxepin and neither are tolerable.  Discussed sleep hygiene and resetting her internal clock.

## 2012-01-22 NOTE — Patient Instructions (Addendum)
Smoking Cessation This document explains the best ways for you to quit smoking and new treatments to help. It lists new medicines that can double or triple your chances of quitting and quitting for good. It also considers ways to avoid relapses and concerns you may have about quitting, including weight gain. NICOTINE: A POWERFUL ADDICTION If you have tried to quit smoking, you know how hard it can be. It is hard because nicotine is a very addictive drug. For some people, it can be as addictive as heroin or cocaine. Usually, people make 2 or 3 tries, or more, before finally being able to quit. Each time you try to quit, you can learn about what helps and what hurts. Quitting takes hard work and a lot of effort, but you can quit smoking. QUITTING SMOKING IS ONE OF THE MOST IMPORTANT THINGS YOU WILL EVER DO.  You will live longer, feel better, and live better.   The impact on your body of quitting smoking is felt almost immediately:   Within 20 minutes, blood pressure decreases. Pulse returns to its normal level.   After 8 hours, carbon monoxide levels in the blood return to normal. Oxygen level increases.   After 24 hours, chance of heart attack starts to decrease. Breath, hair, and body stop smelling like smoke.   After 48 hours, damaged nerve endings begin to recover. Sense of taste and smell improve.   After 72 hours, the body is virtually free of nicotine. Bronchial tubes relax and breathing becomes easier.   After 2 to 12 weeks, lungs can hold more air. Exercise becomes easier and circulation improves.   Quitting will reduce your risk of having a heart attack, stroke, cancer, or lung disease:   After 1 year, the risk of coronary heart disease is cut in half.   After 5 years, the risk of stroke falls to the same as a nonsmoker.   After 10 years, the risk of lung cancer is cut in half and the risk of other cancers decreases significantly.   After 15 years, the risk of coronary heart  disease drops, usually to the level of a nonsmoker.   If you are pregnant, quitting smoking will improve your chances of having a healthy baby.   The people you live with, especially your children, will be healthier.   You will have extra money to spend on things other than cigarettes.  FIVE KEYS TO QUITTING Studies have shown that these 5 steps will help you quit smoking and quit for good. You have the best chances of quitting if you use them together: 1. Get ready.  2. Get support and encouragement.  3. Learn new skills and behaviors.  4. Get medicine to reduce your nicotine addiction and use it correctly.  5. Be prepared for relapse or difficult situations. Be determined to continue trying to quit, even if you do not succeed at first.  1. GET READY  Set a quit date.   Change your environment.   Get rid of ALL cigarettes, ashtrays, matches, and lighters in your home, car, and place of work.   Do not let people smoke in your home.   Review your past attempts to quit. Think about what worked and what did not.   Once you quit, do not smoke. NOT EVEN A PUFF!  2. GET SUPPORT AND ENCOURAGEMENT Studies have shown that you have a better chance of being successful if you have help. You can get support in many ways.  Tell   your family, friends, and coworkers that you are going to quit and need their support. Ask them not to smoke around you.   Talk to your caregivers (doctor, dentist, nurse, pharmacist, psychologist, and/or smoking counselor).   Get individual, group, or telephone counseling and support. The more counseling you have, the better your chances are of quitting. Programs are available at local hospitals and health centers. Call your local health department for information about programs in your area.   Spiritual beliefs and practices may help some smokers quit.   Quit meters are small computer programs online or downloadable that keep track of quit statistics, such as amount  of "quit-time," cigarettes not smoked, and money saved.   Many smokers find one or more of the many self-help books available useful in helping them quit and stay off tobacco.  3. LEARN NEW SKILLS AND BEHAVIORS  Try to distract yourself from urges to smoke. Talk to someone, go for a walk, or occupy your time with a task.   When you first try to quit, change your routine. Take a different route to work. Drink tea instead of coffee. Eat breakfast in a different place.   Do something to reduce your stress. Take a hot bath, exercise, or read a book.   Plan something enjoyable to do every day. Reward yourself for not smoking.   Explore interactive web-based programs that specialize in helping you quit.  4. GET MEDICINE AND USE IT CORRECTLY Medicines can help you stop smoking and decrease the urge to smoke. Combining medicine with the above behavioral methods and support can quadruple your chances of successfully quitting smoking. The [place] Food and Drug Administration (FDA) has approved 7 medicines to help you quit smoking. These medicines fall into 3 categories.  Nicotine replacement therapy (delivers nicotine to your body without the negative effects and risks of smoking):   Nicotine gum: Available over-the-counter.   Nicotine lozenges: Available over-the-counter.   Nicotine inhaler: Available by prescription.   Nicotine nasal spray: Available by prescription.   Nicotine skin patches (transdermal): Available by prescription and over-the-counter.   Antidepressant medicine (helps people abstain from smoking, but how this works is unknown):   Bupropion sustained-release (SR) tablets: Available by prescription.   Nicotinic receptor partial agonist (simulates the effect of nicotine in your brain):   Varenicline tartrate tablets: Available by prescription.   Ask your caregiver for advice about which medicines to use and how to use them. Carefully read the information on the package.    Everyone who is trying to quit may benefit from using a medicine. If you are pregnant or trying to become pregnant, nursing an infant, you are under age 18, or you smoke fewer than 10 cigarettes per day, talk to your caregiver before taking any nicotine replacement medicines.   You should stop using a nicotine replacement product and call your caregiver if you experience nausea, dizziness, weakness, vomiting, fast or irregular heartbeat, mouth problems with the lozenge or gum, or redness or swelling of the skin around the patch that does not go away.   Do not use any other product containing nicotine while using a nicotine replacement product.   Talk to your caregiver before using these products if you have diabetes, heart disease, asthma, stomach ulcers, you had a recent heart attack, you have high blood pressure that is not controlled with medicine, a history of irregular heartbeat, or you have been prescribed medicine to help you quit smoking.  5. BE PREPARED FOR RELAPSE OR   DIFFICULT SITUATIONS  Most relapses occur within the first 3 months after quitting. Do not be discouraged if you start smoking again. Remember, most people try several times before they finally quit.   You may have symptoms of withdrawal because your body is used to nicotine. You may crave cigarettes, be irritable, feel very hungry, cough often, get headaches, or have difficulty concentrating.   The withdrawal symptoms are only temporary. They are strongest when you first quit, but they will go away within 10 to 14 days.  Here are some difficult situations to watch for:  Alcohol. Avoid drinking alcohol. Drinking lowers your chances of successfully quitting.   Caffeine. Try to reduce the amount of caffeine you consume. It also lowers your chances of successfully quitting.   Other smokers. Being around smoking can make you want to smoke. Avoid smokers.   Weight gain. Many smokers will gain weight when they quit, usually  less than 10 pounds. Eat a healthy diet and stay active. Do not let weight gain distract you from your main goal, quitting smoking. Some medicines that help you quit smoking may also help delay weight gain. You can always lose the weight gained after you quit.   Bad mood or depression. There are a lot of ways to improve your mood other than smoking.  If you are having problems with any of these situations, talk to your caregiver. SPECIAL SITUATIONS AND CONDITIONS Studies suggest that everyone can quit smoking. Your situation or condition can give you a special reason to quit.  Pregnant women/new mothers: By quitting, you protect your baby's health and your own.   Hospitalized patients: By quitting, you reduce health problems and help healing.   Heart attack patients: By quitting, you reduce your risk of a second heart attack.   Lung, head, and neck cancer patients: By quitting, you reduce your chance of a second cancer.   Parents of children and adolescents: By quitting, you protect your children from illnesses caused by secondhand smoke.  QUESTIONS TO THINK ABOUT Think about the following questions before you try to stop smoking. You may want to talk about your answers with your caregiver.  Why do you want to quit?   If you tried to quit in the past, what helped and what did not?   What will be the most difficult situations for you after you quit? How will you plan to handle them?   Who can help you through the tough times? Your family? Friends? Caregiver?   What pleasures do you get from smoking? What ways can you still get pleasure if you quit?  Here are some questions to ask your caregiver:  How can you help me to be successful at quitting?   What medicine do you think would be best for me and how should I take it?   What should I do if I need more help?   What is smoking withdrawal like? How can I get information on withdrawal?  Quitting takes hard work and a lot of effort,  but you can quit smoking. FOR MORE INFORMATION  Smokefree.gov (http://www.davis-sullivan.com/) provides free, accurate, evidence-based information and professional assistance to help support the immediate and long-term needs of people trying to quit smoking. Document Released: 12/04/2001 Document Revised: 08/22/2011 Document Reviewed: 09/26/2009 [facility] Patient Information 2012 [place], [place]. Insomnia Insomnia is frequent trouble falling and/or staying asleep. Insomnia can be a long term problem or a short term problem. Both are common. Insomnia can be a short term problem  when the wakefulness is related to a certain stress or worry. Long term insomnia is often related to ongoing stress during waking hours and/or poor sleeping habits. Overtime, sleep deprivation itself can make the problem worse. Every little thing feels more severe because you are overtired and your ability to cope is decreased. CAUSES   Stress, anxiety, and depression.   Poor sleeping habits.   Distractions such as TV in the bedroom.   Naps close to bedtime.   Engaging in emotionally charged conversations before bed.   Technical reading before sleep.   Alcohol and other sedatives. They may make the problem worse. They can hurt normal sleep patterns and normal dream activity.   Stimulants such as caffeine for several hours prior to bedtime.   Pain syndromes and shortness of breath can cause insomnia.   Exercise late at night.   Changing time zones may cause sleeping problems (jet lag).  It is sometimes helpful to have someone observe your sleeping patterns. They should look for periods of not breathing during the night (sleep apnea). They should also look to see how long those periods last. If you live alone or observers are uncertain, you can also be observed at a sleep clinic where your sleep patterns will be professionally monitored. Sleep apnea requires a checkup and treatment. Give your caregivers your medical  history. Give your caregivers observations your family has made about your sleep.  SYMPTOMS   Not feeling rested in the morning.   Anxiety and restlessness at bedtime.   Difficulty falling and staying asleep.  TREATMENT   Your caregiver may prescribe treatment for an underlying medical disorders. Your caregiver can give advice or help if you are using alcohol or other drugs for self-medication. Treatment of underlying problems will usually eliminate insomnia problems.   Medications can be prescribed for short time use. They are generally not recommended for lengthy use.   Over-the-counter sleep medicines are not recommended for lengthy use. They can be habit forming.   You can promote easier sleeping by making lifestyle changes such as:   Using relaxation techniques that help with breathing and reduce muscle tension.   Exercising earlier in the day.   Changing your diet and the time of your last meal. No night time snacks.   Establish a regular time to go to bed.   Counseling can help with stressful problems and worry.   Soothing music and white noise may be helpful if there are background noises you cannot remove.   Stop tedious detailed work at least one hour before bedtime.  HOME CARE INSTRUCTIONS   Keep a diary. Inform your caregiver about your progress. This includes any medication side effects. See your caregiver regularly. Take note of:   Times when you are asleep.   Times when you are awake during the night.   The quality of your sleep.   How you feel the next day.  This information will help your caregiver care for you.  Get out of bed if you are still awake after 15 minutes. Read or do some quiet activity. Keep the lights down. Wait until you feel sleepy and go back to bed.   Keep regular sleeping and waking hours. Avoid naps.   Exercise regularly.   Avoid distractions at bedtime. Distractions include watching television or engaging in any intense or  detailed activity like attempting to balance the household checkbook.   Develop a bedtime ritual. Keep a familiar routine of bathing, brushing your teeth, climbing into  bed at the same time each night, listening to soothing music. Routines increase the success of falling to sleep faster.   Use relaxation techniques. This can be using breathing and muscle tension release routines. It can also include visualizing peaceful scenes. You can also help control troubling or intruding thoughts by keeping your mind occupied with boring or repetitive thoughts like the old concept of counting sheep. You can make it more creative like imagining planting one beautiful flower after another in your backyard garden.   During your day, work to eliminate stress. When this is not possible use some of the previous suggestions to help reduce the anxiety that accompanies stressful situations.  MAKE SURE YOU:   Understand these instructions.   Will watch your condition.   Will get help right away if you are not doing well or get worse.  Document Released: 12/07/2000 Document Revised: 08/22/2011 Document Reviewed: 01/07/2008 [employer] Patient Information 2012 [place], [place].

## 2012-01-23 ENCOUNTER — Encounter: Payer: Self-pay | Admitting: *Deleted

## 2012-01-23 ENCOUNTER — Encounter: Payer: Self-pay | Admitting: Family Medicine

## 2012-01-23 LAB — TSH: TSH: 2.963 u[IU]/mL (ref 0.350–4.500)

## 2012-03-04 ENCOUNTER — Telehealth: Payer: Self-pay | Admitting: Family Medicine

## 2012-03-04 DIAGNOSIS — F410 Panic disorder [episodic paroxysmal anxiety] without agoraphobia: Secondary | ICD-10-CM

## 2012-03-04 NOTE — Telephone Encounter (Signed)
Pt is going to [employer] the end of the month and will be flying - she has never flown before and is very anxious.  Wants to know if there is something that can be prescribed for her anxiety.

## 2012-03-10 MED ORDER — CLONAZEPAM 1 MG PO TABS: 1.0000 mg | ORAL_TABLET | Freq: Two times a day (BID) | ORAL | Status: AC | PRN

## 2012-03-10 NOTE — Telephone Encounter (Signed)
[name] calling back to ask that Dr. Shawnie Pons call in something for anxiety due to upcoming flight trip.

## 2012-09-04 ENCOUNTER — Encounter: Payer: Self-pay | Admitting: Family Medicine

## 2012-09-04 ENCOUNTER — Ambulatory Visit (INDEPENDENT_AMBULATORY_CARE_PROVIDER_SITE_OTHER): Payer: Self-pay | Admitting: Family Medicine

## 2012-09-04 VITALS — BP 129/84 | HR 77 | Temp 98.4°F | Ht 62.5 in | Wt 210.0 lb

## 2012-09-04 DIAGNOSIS — N912 Amenorrhea, unspecified: Secondary | ICD-10-CM

## 2012-09-04 DIAGNOSIS — G4700 Insomnia, unspecified: Secondary | ICD-10-CM

## 2012-09-04 DIAGNOSIS — F329 Major depressive disorder, single episode, unspecified: Secondary | ICD-10-CM

## 2012-09-04 DIAGNOSIS — I10 Essential (primary) hypertension: Secondary | ICD-10-CM

## 2012-09-04 DIAGNOSIS — N920 Excessive and frequent menstruation with regular cycle: Secondary | ICD-10-CM

## 2012-09-04 DIAGNOSIS — F3289 Other specified depressive episodes: Secondary | ICD-10-CM

## 2012-09-04 DIAGNOSIS — F17200 Nicotine dependence, unspecified, uncomplicated: Secondary | ICD-10-CM

## 2012-09-04 MED ORDER — MEDROXYPROGESTERONE ACETATE 5 MG PO TABS: 5.0000 mg | ORAL_TABLET | Freq: Every day | ORAL | Status: DC

## 2012-09-04 MED ORDER — BUPROPION HCL 75 MG PO TABS: 75.0000 mg | ORAL_TABLET | Freq: Two times a day (BID) | ORAL | Status: DC

## 2012-09-04 MED ORDER — HYDROCHLOROTHIAZIDE 25 MG PO TABS: 25.0000 mg | ORAL_TABLET | Freq: Every day | ORAL | Status: DC

## 2012-09-04 NOTE — Patient Instructions (Addendum)
Depression, Adolescent and Adult Depression is a true and treatable medical condition. In general there are two kinds of depression:  Depression we all experience in some form. For example depression from the death of a loved one, financial distress or natural disasters will trigger or increase depression.   Clinical depression, on the other hand, appears without an apparent cause or reason. This depression is a disease. Depression may be caused by chemical imbalance in the body and brain or may come as a response to a physical illness. Alcohol and other drugs can cause depression.  DIAGNOSIS  The diagnosis of depression is usually based upon symptoms and medical history. TREATMENT  Treatments for depression fall into three categories. These are:  Drug therapy. There are many medicines that treat depression. Responses may vary and sometimes trial and error is necessary to determine the best medicines and dosage for a particular patient.   Psychotherapy, also called talking treatments, helps people resolve their problems by looking at them from a different point of view and by giving people insight into their own personal makeup. Traditional psychotherapy looks at a childhood source of a problem. Other psychotherapy will look at current conflicts and move toward solving those. If the cause of depression is drug use, counseling is available to help abstain. In time the depression will usually improve. If there were underlying causes for the chemical use, they can be addressed.   ECT (electroconvulsive therapy) or shock treatment is not as commonly used today. It is a very effective treatment for severe suicidal depression. During ECT electrical impulses are applied to the head. These impulses cause a generalized seizure. It can be effective but causes a loss of memory for recent events. Sometimes this loss of memory may include the last several months.  Treat all depression or suicide threats as  serious. Obtain professional help. Do not wait to see if serious depression will get better over time without help. Seek help for yourself or those around you. In the [place] the number to the National Suicide Help Lines With 24 Hour Help Are: 1-800-SUICIDE 254-529-5502 Document Released: 12/07/2000 Document Revised: 11/29/2011 Document Reviewed: 07/28/2008 [facility] Patient Information 2012 [place], [place]. Insomnia Insomnia is frequent trouble falling and/or staying asleep. Insomnia can be a long term problem or a short term problem. Both are common. Insomnia can be a short term problem when the wakefulness is related to a certain stress or worry. Long term insomnia is often related to ongoing stress during waking hours and/or poor sleeping habits. Overtime, sleep deprivation itself can make the problem worse. Every little thing feels more severe because you are overtired and your ability to cope is decreased. CAUSES   Stress, anxiety, and depression.   Poor sleeping habits.   Distractions such as TV in the bedroom.   Naps close to bedtime.   Engaging in emotionally charged conversations before bed.   Technical reading before sleep.   Alcohol and other sedatives. They may make the problem worse. They can hurt normal sleep patterns and normal dream activity.   Stimulants such as caffeine for several hours prior to bedtime.   Pain syndromes and shortness of breath can cause insomnia.   Exercise late at night.   Changing time zones may cause sleeping problems (jet lag).  It is sometimes helpful to have someone observe your sleeping patterns. They should look for periods of not breathing during the night (sleep apnea). They should also look to see how long those periods last. If you  live alone or observers are uncertain, you can also be observed at a sleep clinic where your sleep patterns will be professionally monitored. Sleep apnea requires a checkup and treatment. Give your caregivers  your medical history. Give your caregivers observations your family has made about your sleep.  SYMPTOMS   Not feeling rested in the morning.   Anxiety and restlessness at bedtime.   Difficulty falling and staying asleep.  TREATMENT   Your caregiver may prescribe treatment for an underlying medical disorders. Your caregiver can give advice or help if you are using alcohol or other drugs for self-medication. Treatment of underlying problems will usually eliminate insomnia problems.   Medications can be prescribed for short time use. They are generally not recommended for lengthy use.   Over-the-counter sleep medicines are not recommended for lengthy use. They can be habit forming.   You can promote easier sleeping by making lifestyle changes such as:   Using relaxation techniques that help with breathing and reduce muscle tension.   Exercising earlier in the day.   Changing your diet and the time of your last meal. No night time snacks.   Establish a regular time to go to bed.   Counseling can help with stressful problems and worry.   Soothing music and white noise may be helpful if there are background noises you cannot remove.   Stop tedious detailed work at least one hour before bedtime.  HOME CARE INSTRUCTIONS   Keep a diary. Inform your caregiver about your progress. This includes any medication side effects. See your caregiver regularly. Take note of:   Times when you are asleep.   Times when you are awake during the night.   The quality of your sleep.   How you feel the next day.  This information will help your caregiver care for you.  Get out of bed if you are still awake after 15 minutes. Read or do some quiet activity. Keep the lights down. Wait until you feel sleepy and go back to bed.   Keep regular sleeping and waking hours. Avoid naps.   Exercise regularly.   Avoid distractions at bedtime. Distractions include watching television or engaging in any  intense or detailed activity like attempting to balance the household checkbook.   Develop a bedtime ritual. Keep a familiar routine of bathing, brushing your teeth, climbing into bed at the same time each night, listening to soothing music. Routines increase the success of falling to sleep faster.   Use relaxation techniques. This can be using breathing and muscle tension release routines. It can also include visualizing peaceful scenes. You can also help control troubling or intruding thoughts by keeping your mind occupied with boring or repetitive thoughts like the old concept of counting sheep. You can make it more creative like imagining planting one beautiful flower after another in your backyard garden.   During your day, work to eliminate stress. When this is not possible use some of the previous suggestions to help reduce the anxiety that accompanies stressful situations.  MAKE SURE YOU:   Understand these instructions.   Will watch your condition.   Will get help right away if you are not doing well or get worse.  Document Released: 12/07/2000 Document Revised: 11/29/2011 Document Reviewed: 01/07/2008 [facility] Patient Information 2012 [place], [place]. Smoking Cessation This document explains the best ways for you to quit smoking and new treatments to help. It lists new medicines that can double or triple your chances of quitting and quitting  for good. It also considers ways to avoid relapses and concerns you may have about quitting, including weight gain. NICOTINE: A POWERFUL ADDICTION If you have tried to quit smoking, you know how hard it can be. It is hard because nicotine is a very addictive drug. For some people, it can be as addictive as heroin or cocaine. Usually, people make 2 or 3 tries, or more, before finally being able to quit. Each time you try to quit, you can learn about what helps and what hurts. Quitting takes hard work and a lot of effort, but you can quit  smoking. QUITTING SMOKING IS ONE OF THE MOST IMPORTANT THINGS YOU WILL EVER DO.  You will live longer, feel better, and live better.   The impact on your body of quitting smoking is felt almost immediately:   Within 20 minutes, blood pressure decreases. Pulse returns to its normal level.   After 8 hours, carbon monoxide levels in the blood return to normal. Oxygen level increases.   After 24 hours, chance of heart attack starts to decrease. Breath, hair, and body stop smelling like smoke.   After 48 hours, damaged nerve endings begin to recover. Sense of taste and smell improve.   After 72 hours, the body is virtually free of nicotine. Bronchial tubes relax and breathing becomes easier.   After 2 to 12 weeks, lungs can hold more air. Exercise becomes easier and circulation improves.   Quitting will reduce your risk of having a heart attack, stroke, cancer, or lung disease:   After 1 year, the risk of coronary heart disease is cut in half.   After 5 years, the risk of stroke falls to the same as a nonsmoker.   After 10 years, the risk of lung cancer is cut in half and the risk of other cancers decreases significantly.   After 15 years, the risk of coronary heart disease drops, usually to the level of a nonsmoker.   If you are pregnant, quitting smoking will improve your chances of having a healthy baby.   The people you live with, especially your children, will be healthier.   You will have extra money to spend on things other than cigarettes.  FIVE KEYS TO QUITTING Studies have shown that these 5 steps will help you quit smoking and quit for good. You have the best chances of quitting if you use them together: 1. Get ready.  2. Get support and encouragement.  3. Learn new skills and behaviors.  4. Get medicine to reduce your nicotine addiction and use it correctly.  5. Be prepared for relapse or difficult situations. Be determined to continue trying to quit, even if you do not  succeed at first.  1. GET READY  Set a quit date.   Change your environment.   Get rid of ALL cigarettes, ashtrays, matches, and lighters in your home, car, and place of work.   Do not let people smoke in your home.   Review your past attempts to quit. Think about what worked and what did not.   Once you quit, do not smoke. NOT EVEN A PUFF!  2. GET SUPPORT AND ENCOURAGEMENT Studies have shown that you have a better chance of being successful if you have help. You can get support in many ways.  Tell your family, friends, and coworkers that you are going to quit and need their support. Ask them not to smoke around you.   Talk to your caregivers (doctor, dentist, nurse, pharmacist,  psychologist, and/or smoking counselor).   Get individual, group, or telephone counseling and support. The more counseling you have, the better your chances are of quitting. Programs are available at [employer] and health centers. Call your local health department for information about programs in your area.   Spiritual beliefs and practices may help some smokers quit.   Quit meters are Photographer that keep track of quit statistics, such as amount of "quit-time," cigarettes not smoked, and money saved.   Many smokers find one or more of the many self-help books available useful in helping them quit and stay off tobacco.  3. LEARN NEW SKILLS AND BEHAVIORS  Try to distract yourself from urges to smoke. Talk to someone, go for a walk, or occupy your time with a task.   When you first try to quit, change your routine. Take a different route to work. Drink tea instead of coffee. Eat breakfast in a different place.   Do something to reduce your stress. Take a hot bath, exercise, or read a book.   Plan something enjoyable to do every day. Reward yourself for not smoking.   Explore interactive web-based programs that specialize in helping you quit.  4. GET MEDICINE AND  USE IT CORRECTLY Medicines can help you stop smoking and decrease the urge to smoke. Combining medicine with the above behavioral methods and support can quadruple your chances of successfully quitting smoking. The [place] Food and Drug Administration (FDA) has approved 7 medicines to help you quit smoking. These medicines fall into 3 categories.  Nicotine replacement therapy (delivers nicotine to your body without the negative effects and risks of smoking):   Nicotine gum: Available over-the-counter.   Nicotine lozenges: Available over-the-counter.   Nicotine inhaler: Available by prescription.   Nicotine nasal spray: Available by prescription.   Nicotine skin patches (transdermal): Available by prescription and over-the-counter.   Antidepressant medicine (helps people abstain from smoking, but how this works is unknown):   Bupropion sustained-release (SR) tablets: Available by prescription.   Nicotinic receptor partial agonist (simulates the effect of nicotine in your brain):   Varenicline tartrate tablets: Available by prescription.   Ask your caregiver for advice about which medicines to use and how to use them. Carefully read the information on the package.   Everyone who is trying to quit may benefit from using a medicine. If you are pregnant or trying to become pregnant, nursing an infant, you are under age 42, or you smoke fewer than 10 cigarettes per day, talk to your caregiver before taking any nicotine replacement medicines.   You should stop using a nicotine replacement product and call your caregiver if you experience nausea, dizziness, weakness, vomiting, fast or irregular heartbeat, mouth problems with the lozenge or gum, or redness or swelling of the skin around the patch that does not go away.   Do not use any other product containing nicotine while using a nicotine replacement product.   Talk to your caregiver before using these products if you have diabetes, heart  disease, asthma, stomach ulcers, you had a recent heart attack, you have high blood pressure that is not controlled with medicine, a history of irregular heartbeat, or you have been prescribed medicine to help you quit smoking.  5. BE PREPARED FOR RELAPSE OR DIFFICULT SITUATIONS  Most relapses occur within the first 3 months after quitting. Do not be discouraged if you start smoking again. Remember, most people try several times before they finally quit.  You may have symptoms of withdrawal because your body is used to nicotine. You may crave cigarettes, be irritable, feel very hungry, cough often, get headaches, or have difficulty concentrating.   The withdrawal symptoms are only temporary. They are strongest when you first quit, but they will go away within 10 to 14 days.  Here are some difficult situations to watch for:  Alcohol. Avoid drinking alcohol. Drinking lowers your chances of successfully quitting.   Caffeine. Try to reduce the amount of caffeine you consume. It also lowers your chances of successfully quitting.   Other smokers. Being around smoking can make you want to smoke. Avoid smokers.   Weight gain. Many smokers will gain weight when they quit, usually less than 10 pounds. Eat a healthy diet and stay active. Do not let weight gain distract you from your main goal, quitting smoking. Some medicines that help you quit smoking may also help delay weight gain. You can always lose the weight gained after you quit.   Bad mood or depression. There are a lot of ways to improve your mood other than smoking.  If you are having problems with any of these situations, talk to your caregiver. SPECIAL SITUATIONS AND CONDITIONS Studies suggest that everyone can quit smoking. Your situation or condition can give you a special reason to quit.  Pregnant women/new mothers: By quitting, you protect your baby's health and your own.   Hospitalized patients: By quitting, you reduce health  problems and help healing.   Heart attack patients: By quitting, you reduce your risk of a second heart attack.   Lung, head, and neck cancer patients: By quitting, you reduce your chance of a second cancer.   Parents of children and adolescents: By quitting, you protect your children from illnesses caused by secondhand smoke.  QUESTIONS TO THINK ABOUT Think about the following questions before you try to stop smoking. You may want to talk about your answers with your caregiver.  Why do you want to quit?   If you tried to quit in the past, what helped and what did not?   What will be the most difficult situations for you after you quit? How will you plan to handle them?   Who can help you through the tough times? Your family? Friends? Caregiver?   What pleasures do you get from smoking? What ways can you still get pleasure if you quit?  Here are some questions to ask your caregiver:  How can you help me to be successful at quitting?   What medicine do you think would be best for me and how should I take it?   What should I do if I need more help?   What is smoking withdrawal like? How can I get information on withdrawal?  Quitting takes hard work and a lot of effort, but you can quit smoking. FOR MORE INFORMATION  Smokefree.gov (http://www.davis-sullivan.com/) provides free, accurate, evidence-based information and professional assistance to help support the immediate and long-term needs of people trying to quit smoking. Document Released: 12/04/2001 Document Revised: 11/29/2011 Document Reviewed: 09/26/2009 [facility] Patient Information 2012 [place], [place].

## 2012-09-04 NOTE — Assessment & Plan Note (Signed)
Continue provera as needed

## 2012-09-04 NOTE — Assessment & Plan Note (Signed)
Trial of Wellbutrin

## 2012-09-04 NOTE — Progress Notes (Signed)
  Subjective:    Patient ID: Sandra Pittman, female    DOB: [birth year], 31 y.o.   MRN: [id number]  HPI Still depressed and not sleeping.  Reports frequent insomnia.  She has failed many drugs in the past.  Stopped celexa as it was making her tired.  Reports continued anhedonia.  About to finish her masters work.  Still feels depressed. Found knot on left breast that is tender.  Seems to still be sore.   Review of Systems  Constitutional: Negative for fever and chills.  Respiratory: Negative for chest tightness and shortness of breath.   Gastrointestinal: Negative for nausea, vomiting and abdominal pain.  Genitourinary: Positive for menstrual problem (heavy cycles).       Objective:   Physical Exam  Vitals reviewed. Constitutional: She appears well-developed and well-nourished.  HENT:  Head: Normocephalic and atraumatic.  Eyes: No scleral icterus.  Neck: Neck supple.  Cardiovascular: Normal rate.   Pulmonary/Chest: Effort normal. Right breast exhibits no inverted nipple, no mass and no nipple discharge. Left breast exhibits tenderness (left rib cage). Left breast exhibits no inverted nipple, no mass and no nipple discharge.          Assessment & Plan:  No evidence of breast mass--pt. Is feeling ribs which are mildly tender.

## 2012-09-04 NOTE — Assessment & Plan Note (Signed)
Still having trouble--does not want anything for this

## 2012-09-04 NOTE — Assessment & Plan Note (Signed)
BP is well controlled 

## 2012-09-04 NOTE — Assessment & Plan Note (Signed)
Switch to Wellbutrin for sx control and aid in smoking cessation

## 2013-06-03 ENCOUNTER — Encounter: Payer: Self-pay | Admitting: Family Medicine

## 2013-08-27 ENCOUNTER — Encounter: Payer: Self-pay | Admitting: Family Medicine

## 2013-08-27 ENCOUNTER — Ambulatory Visit (INDEPENDENT_AMBULATORY_CARE_PROVIDER_SITE_OTHER): Payer: BC Managed Care – PPO | Admitting: Family Medicine

## 2013-08-27 VITALS — BP 127/58 | HR 93 | Temp 99.1°F | Ht 63.0 in | Wt 209.0 lb

## 2013-08-27 DIAGNOSIS — G4700 Insomnia, unspecified: Secondary | ICD-10-CM

## 2013-08-27 DIAGNOSIS — E669 Obesity, unspecified: Secondary | ICD-10-CM

## 2013-08-27 DIAGNOSIS — R4589 Other symptoms and signs involving emotional state: Secondary | ICD-10-CM

## 2013-08-27 DIAGNOSIS — F329 Major depressive disorder, single episode, unspecified: Secondary | ICD-10-CM

## 2013-08-27 DIAGNOSIS — F17200 Nicotine dependence, unspecified, uncomplicated: Secondary | ICD-10-CM

## 2013-08-27 DIAGNOSIS — Z0000 Encounter for general adult medical examination without abnormal findings: Secondary | ICD-10-CM

## 2013-08-27 DIAGNOSIS — F410 Panic disorder [episodic paroxysmal anxiety] without agoraphobia: Secondary | ICD-10-CM

## 2013-08-27 DIAGNOSIS — I10 Essential (primary) hypertension: Secondary | ICD-10-CM

## 2013-08-27 MED ORDER — VENLAFAXINE HCL ER 37.5 MG PO CP24: 37.5000 mg | ORAL_CAPSULE | Freq: Every day | ORAL | Status: DC

## 2013-08-27 MED ORDER — HYDROCHLOROTHIAZIDE 25 MG PO TABS: 25.0000 mg | ORAL_TABLET | Freq: Every day | ORAL | Status: DC

## 2013-08-27 NOTE — Patient Instructions (Addendum)
Preventive Care for Adults, Female A healthy lifestyle and preventive care can promote health and wellness. Preventive health guidelines for women include the following key practices.  A routine yearly physical is a good way to check with your caregiver about your health and preventive screening. It is a chance to share any concerns and updates on your health, and to receive a thorough exam.  Visit your dentist for a routine exam and preventive care every 6 months. Brush your teeth twice a day and floss once a day. Good oral hygiene prevents tooth decay and gum disease.  The frequency of eye exams is based on your age, health, family medical history, use of contact lenses, and other factors. Follow your caregiver's recommendations for frequency of eye exams.  Eat a healthy diet. Foods like vegetables, fruits, whole grains, low-fat dairy products, and lean protein foods contain the nutrients you need without too many calories. Decrease your intake of foods high in solid fats, added sugars, and salt. Eat the right amount of calories for you.Get information about a proper diet from your caregiver, if necessary.  Regular physical exercise is one of the most important things you can do for your health. Most adults should get at least 150 minutes of moderate-intensity exercise (any activity that increases your heart rate and causes you to sweat) each week. In addition, most adults need muscle-strengthening exercises on 2 or more days a week.  Maintain a healthy weight. The body mass index (BMI) is a screening tool to identify possible weight problems. It provides an estimate of body fat based on height and weight. Your caregiver can help determine your BMI, and can help you achieve or maintain a healthy weight.For adults 20 years and older:  A BMI below 18.5 is considered underweight.  A BMI of 18.5 to 24.9 is normal.  A BMI of 25 to 29.9 is considered overweight.  A BMI of 30 and above is  considered obese.  Maintain normal blood lipids and cholesterol levels by exercising and minimizing your intake of saturated fat. Eat a balanced diet with plenty of fruit and vegetables. Blood tests for lipids and cholesterol should begin at age 20 and be repeated every 5 years. If your lipid or cholesterol levels are high, you are over 50, or you are at high risk for heart disease, you may need your cholesterol levels checked more frequently.Ongoing high lipid and cholesterol levels should be treated with medicines if diet and exercise are not effective.  If you smoke, find out from your caregiver how to quit. If you do not use tobacco, do not start.  If you are pregnant, do not drink alcohol. If you are breastfeeding, be very cautious about drinking alcohol. If you are not pregnant and choose to drink alcohol, do not exceed 1 drink per day. One drink is considered to be 12 ounces (355 mL) of beer, 5 ounces (148 mL) of wine, or 1.5 ounces (44 mL) of liquor.  Avoid use of street drugs. Do not share needles with anyone. Ask for help if you need support or instructions about stopping the use of drugs.  High blood pressure causes heart disease and increases the risk of stroke. Your blood pressure should be checked at least every 1 to 2 years. Ongoing high blood pressure should be treated with medicines if weight loss and exercise are not effective.  If you are 55 to 31 years old, ask your caregiver if you should take aspirin to prevent strokes.  Diabetes   screening involves taking a blood sample to check your fasting blood sugar level. This should be done once every 3 years, after age 45, if you are within normal weight and without risk factors for diabetes. Testing should be considered at a younger age or be carried out more frequently if you are overweight and have at least 1 risk factor for diabetes.  Breast cancer screening is essential preventive care for women. You should practice "breast  self-awareness." This means understanding the normal appearance and feel of your breasts and may include breast self-examination. Any changes detected, no matter how small, should be reported to a caregiver. Women in their 20s and 30s should have a clinical breast exam (CBE) by a caregiver as part of a regular health exam every 1 to 3 years. After age 40, women should have a CBE every year. Starting at age 40, women should consider having a mammography (breast X-ray test) every year. Women who have a family history of breast cancer should talk to their caregiver about genetic screening. Women at a high risk of breast cancer should talk to their caregivers about having magnetic resonance imaging (MRI) and a mammography every year.  The Pap test is a screening test for cervical cancer. A Pap test can show cell changes on the cervix that might become cervical cancer if left untreated. A Pap test is a procedure in which cells are obtained and examined from the lower end of the uterus (cervix).  Women should have a Pap test starting at age 21.  Between ages 21 and 29, Pap tests should be repeated every 2 years.  Beginning at age 30, you should have a Pap test every 3 years as long as the past 3 Pap tests have been normal.  Some women have medical problems that increase the chance of getting cervical cancer. Talk to your caregiver about these problems. It is especially important to talk to your caregiver if a new problem develops soon after your last Pap test. In these cases, your caregiver may recommend more frequent screening and Pap tests.  The above recommendations are the same for women who have or have not gotten the vaccine for human papillomavirus (HPV).  If you had a hysterectomy for a problem that was not cancer or a condition that could lead to cancer, then you no longer need Pap tests. Even if you no longer need a Pap test, a regular exam is a good idea to make sure no other problems are  starting.  If you are between ages 65 and 70, and you have had normal Pap tests going back 10 years, you no longer need Pap tests. Even if you no longer need a Pap test, a regular exam is a good idea to make sure no other problems are starting.  If you have had past treatment for cervical cancer or a condition that could lead to cancer, you need Pap tests and screening for cancer for at least 20 years after your treatment.  If Pap tests have been discontinued, risk factors (such as a new sexual partner) need to be reassessed to determine if screening should be resumed.  The HPV test is an additional test that may be used for cervical cancer screening. The HPV test looks for the virus that can cause the cell changes on the cervix. The cells collected during the Pap test can be tested for HPV. The HPV test could be used to screen women aged 30 years and older, and should   be used in women of any age who have unclear Pap test results. After the age of 30, women should have HPV testing at the same frequency as a Pap test.  Colorectal cancer can be detected and often prevented. Most routine colorectal cancer screening begins at the age of 50 and continues through age 75. However, your caregiver may recommend screening at an earlier age if you have risk factors for colon cancer. On a yearly basis, your caregiver may provide home test kits to check for hidden blood in the stool. Use of a small camera at the end of a tube, to directly examine the colon (sigmoidoscopy or colonoscopy), can detect the earliest forms of colorectal cancer. Talk to your caregiver about this at age 50, when routine screening begins. Direct examination of the colon should be repeated every 5 to 10 years through age 75, unless early forms of pre-cancerous polyps or small growths are found.  Hepatitis C blood testing is recommended for all people born from 1945 through 1965 and any individual with known risks for hepatitis C.  Practice  safe sex. Use condoms and avoid high-risk sexual practices to reduce the spread of sexually transmitted infections (STIs). STIs include gonorrhea, chlamydia, syphilis, trichomonas, herpes, HPV, and human immunodeficiency virus (HIV). Herpes, HIV, and HPV are viral illnesses that have no cure. They can result in disability, cancer, and death. Sexually active women aged 25 and younger should be checked for chlamydia. Older women with new or multiple partners should also be tested for chlamydia. Testing for other STIs is recommended if you are sexually active and at increased risk.  Osteoporosis is a disease in which the bones lose minerals and strength with aging. This can result in serious bone fractures. The risk of osteoporosis can be identified using a bone density scan. Women ages 65 and over and women at risk for fractures or osteoporosis should discuss screening with their caregivers. Ask your caregiver whether you should take a calcium supplement or vitamin D to reduce the rate of osteoporosis.  Menopause can be associated with physical symptoms and risks. Hormone replacement therapy is available to decrease symptoms and risks. You should talk to your caregiver about whether hormone replacement therapy is right for you.  Use sunscreen with sun protection factor (SPF) of 30 or more. Apply sunscreen liberally and repeatedly throughout the day. You should seek shade when your shadow is shorter than you. Protect yourself by wearing long sleeves, pants, a wide-brimmed hat, and sunglasses year round, whenever you are outdoors.  Once a month, do a whole body skin exam, using a mirror to look at the skin on your back. Notify your caregiver of new moles, moles that have irregular borders, moles that are larger than a pencil eraser, or moles that have changed in shape or color.  Stay current with required immunizations.  Influenza. You need a dose every fall (or winter). The composition of the flu vaccine  changes each year, so being vaccinated once is not enough.  Pneumococcal polysaccharide. You need 1 to 2 doses if you smoke cigarettes or if you have certain chronic medical conditions. You need 1 dose at age 65 (or older) if you have never been vaccinated.  Tetanus, diphtheria, pertussis (Tdap, Td). Get 1 dose of Tdap vaccine if you are younger than age 65, are over 65 and have contact with an infant, are a healthcare worker, are pregnant, or simply want to be protected from whooping cough. After that, you need a Td   booster dose every 10 years. Consult your caregiver if you have not had at least 3 tetanus and diphtheria-containing shots sometime in your life or have a deep or dirty wound.  HPV. You need this vaccine if you are a woman age 26 or younger. The vaccine is given in 3 doses over 6 months.  Measles, mumps, rubella (MMR). You need at least 1 dose of MMR if you were born in 1957 or later. You may also need a second dose.  Meningococcal. If you are age 19 to 21 and a first-year college student living in a residence hall, or have one of several medical conditions, you need to get vaccinated against meningococcal disease. You may also need additional booster doses.  Zoster (shingles). If you are age 60 or older, you should get this vaccine.  Varicella (chickenpox). If you have never had chickenpox or you were vaccinated but received only 1 dose, talk to your caregiver to find out if you need this vaccine.  Hepatitis A. You need this vaccine if you have a specific risk factor for hepatitis A virus infection or you simply wish to be protected from this disease. The vaccine is usually given as 2 doses, 6 to 18 months apart.  Hepatitis B. You need this vaccine if you have a specific risk factor for hepatitis B virus infection or you simply wish to be protected from this disease. The vaccine is given in 3 doses, usually over 6 months. Preventive Services / Frequency Ages 19 to 39  Blood  pressure check.** / Every 1 to 2 years.  Lipid and cholesterol check.** / Every 5 years beginning at age 20.  Clinical breast exam.** / Every 3 years for women in their 20s and 30s.  Pap test.** / Every 2 years from ages 21 through 29. Every 3 years starting at age 30 through age 65 or 70 with a history of 3 consecutive normal Pap tests.  HPV screening.** / Every 3 years from ages 30 through ages 65 to 70 with a history of 3 consecutive normal Pap tests.  Hepatitis C blood test.** / For any individual with known risks for hepatitis C.  Skin self-exam. / Monthly.  Influenza immunization.** / Every year.  Pneumococcal polysaccharide immunization.** / 1 to 2 doses if you smoke cigarettes or if you have certain chronic medical conditions.  Tetanus, diphtheria, pertussis (Tdap, Td) immunization. / A one-time dose of Tdap vaccine. After that, you need a Td booster dose every 10 years.  HPV immunization. / 3 doses over 6 months, if you are 26 and younger.  Measles, mumps, rubella (MMR) immunization. / You need at least 1 dose of MMR if you were born in 1957 or later. You may also need a second dose.  Meningococcal immunization. / 1 dose if you are age 19 to 21 and a first-year college student living in a residence hall, or have one of several medical conditions, you need to get vaccinated against meningococcal disease. You may also need additional booster doses.  Varicella immunization.** / Consult your caregiver.  Hepatitis A immunization.** / Consult your caregiver. 2 doses, 6 to 18 months apart.  Hepatitis B immunization.** / Consult your caregiver. 3 doses usually over 6 months. Ages 40 to 64  Blood pressure check.** / Every 1 to 2 years.  Lipid and cholesterol check.** / Every 5 years beginning at age 20.  Clinical breast exam.** / Every year after age 40.  Mammogram.** / Every year beginning at age 40   and continuing for as long as you are in good health. Consult with your  caregiver.  Pap test.** / Every 3 years starting at age 30 through age 65 or 70 with a history of 3 consecutive normal Pap tests.  HPV screening.** / Every 3 years from ages 30 through ages 65 to 70 with a history of 3 consecutive normal Pap tests.  Fecal occult blood test (FOBT) of stool. / Every year beginning at age 50 and continuing until age 75. You may not need to do this test if you get a colonoscopy every 10 years.  Flexible sigmoidoscopy or colonoscopy.** / Every 5 years for a flexible sigmoidoscopy or every 10 years for a colonoscopy beginning at age 50 and continuing until age 75.  Hepatitis C blood test.** / For all people born from 1945 through 1965 and any individual with known risks for hepatitis C.  Skin self-exam. / Monthly.  Influenza immunization.** / Every year.  Pneumococcal polysaccharide immunization.** / 1 to 2 doses if you smoke cigarettes or if you have certain chronic medical conditions.  Tetanus, diphtheria, pertussis (Tdap, Td) immunization.** / A one-time dose of Tdap vaccine. After that, you need a Td booster dose every 10 years.  Measles, mumps, rubella (MMR) immunization. / You need at least 1 dose of MMR if you were born in 1957 or later. You may also need a second dose.  Varicella immunization.** / Consult your caregiver.  Meningococcal immunization.** / Consult your caregiver.  Hepatitis A immunization.** / Consult your caregiver. 2 doses, 6 to 18 months apart.  Hepatitis B immunization.** / Consult your caregiver. 3 doses, usually over 6 months. Ages 65 and over  Blood pressure check.** / Every 1 to 2 years.  Lipid and cholesterol check.** / Every 5 years beginning at age 20.  Clinical breast exam.** / Every year after age 40.  Mammogram.** / Every year beginning at age 40 and continuing for as long as you are in good health. Consult with your caregiver.  Pap test.** / Every 3 years starting at age 30 through age 65 or 70 with a 3  consecutive normal Pap tests. Testing can be stopped between 65 and 70 with 3 consecutive normal Pap tests and no abnormal Pap or HPV tests in the past 10 years.  HPV screening.** / Every 3 years from ages 30 through ages 65 or 70 with a history of 3 consecutive normal Pap tests. Testing can be stopped between 65 and 70 with 3 consecutive normal Pap tests and no abnormal Pap or HPV tests in the past 10 years.  Fecal occult blood test (FOBT) of stool. / Every year beginning at age 50 and continuing until age 75. You may not need to do this test if you get a colonoscopy every 10 years.  Flexible sigmoidoscopy or colonoscopy.** / Every 5 years for a flexible sigmoidoscopy or every 10 years for a colonoscopy beginning at age 50 and continuing until age 75.  Hepatitis C blood test.** / For all people born from 1945 through 1965 and any individual with known risks for hepatitis C.  Osteoporosis screening.** / A one-time screening for women ages 65 and over and women at risk for fractures or osteoporosis.  Skin self-exam. / Monthly.  Influenza immunization.** / Every year.  Pneumococcal polysaccharide immunization.** / 1 dose at age 65 (or older) if you have never been vaccinated.  Tetanus, diphtheria, pertussis (Tdap, Td) immunization. / A one-time dose of Tdap vaccine if you are over   65 and have contact with an infant, are a Research scientist (physical sciences), or simply want to be protected from whooping cough. After that, you need a Td booster dose every 10 years.  Varicella immunization.** / Consult your caregiver.  Meningococcal immunization.** / Consult your caregiver.  Hepatitis A immunization.** / Consult your caregiver. 2 doses, 6 to 18 months apart.  Hepatitis B immunization.** / Check with your caregiver. 3 doses, usually over 6 months. ** Family history and personal history of risk and conditions may change your caregiver's recommendations. Document Released: 02/05/2002 Document Revised: 03/03/2012  Document Reviewed: 05/07/2011 [facility] Patient Information 2014 [place], [place]. Smoking Cessation Quitting smoking is important to your health and has many advantages. However, it is not always easy to quit since nicotine is a very addictive drug. Often times, people try 3 times or more before being able to quit. This document explains the best ways for you to prepare to quit smoking. Quitting takes hard work and a lot of effort, but you can do it. ADVANTAGES OF QUITTING SMOKING  You will live longer, feel better, and live better.  Your body will feel the impact of quitting smoking almost immediately.  Within 20 minutes, blood pressure decreases. Your pulse returns to its normal level.  After 8 hours, carbon monoxide levels in the blood return to normal. Your oxygen level increases.  After 24 hours, the chance of having a heart attack starts to decrease. Your breath, hair, and body stop smelling like smoke.  After 48 hours, damaged nerve endings begin to recover. Your sense of taste and smell improve.  After 72 hours, the body is virtually free of nicotine. Your bronchial tubes relax and breathing becomes easier.  After 2 to 12 weeks, lungs can hold more air. Exercise becomes easier and circulation improves.  The risk of having a heart attack, stroke, cancer, or lung disease is greatly reduced.  After 1 year, the risk of coronary heart disease is cut in half.  After 5 years, the risk of stroke falls to the same as a nonsmoker.  After 10 years, the risk of lung cancer is cut in half and the risk of other cancers decreases significantly.  After 15 years, the risk of coronary heart disease drops, usually to the level of a nonsmoker.  If you are pregnant, quitting smoking will improve your chances of having a healthy baby.  The people you live with, especially any children, will be healthier.  You will have extra money to spend on things other than cigarettes. QUESTIONS TO THINK  ABOUT BEFORE ATTEMPTING TO QUIT You may want to talk about your answers with your caregiver.  Why do you want to quit?  If you tried to quit in the past, what helped and what did not?  What will be the most difficult situations for you after you quit? How will you plan to handle them?  Who can help you through the tough times? Your family? Friends? A caregiver?  What pleasures do you get from smoking? What ways can you still get pleasure if you quit? Here are some questions to ask your caregiver:  How can you help me to be successful at quitting?  What medicine do you think would be best for me and how should I take it?  What should I do if I need more help?  What is smoking withdrawal like? How can I get information on withdrawal? GET READY  Set a quit date.  Change your environment by getting rid of  all cigarettes, ashtrays, matches, and lighters in your home, car, or work. Do not let people smoke in your home.  Review your past attempts to quit. Think about what worked and what did not. GET SUPPORT AND ENCOURAGEMENT You have a better chance of being successful if you have help. You can get support in many ways.  Tell your family, friends, and co-workers that you are going to quit and need their support. Ask them not to smoke around you.  Get individual, group, or telephone counseling and support. Programs are available at [employer] and health centers. Call your local health department for information about programs in your area.  Spiritual beliefs and practices may help some smokers quit.  Download a "quit meter" on your computer to keep track of quit statistics, such as how long you have gone without smoking, cigarettes not smoked, and money saved.  Get a self-help book about quitting smoking and staying off of tobacco. LEARN NEW SKILLS AND BEHAVIORS  Distract yourself from urges to smoke. Talk to someone, go for a walk, or occupy your time with a task.  Change  your normal routine. Take a different route to work. Drink tea instead of coffee. Eat breakfast in a different place.  Reduce your stress. Take a hot bath, exercise, or read a book.  Plan something enjoyable to do every day. Reward yourself for not smoking.  Explore interactive web-based programs that specialize in helping you quit. GET MEDICINE AND USE IT CORRECTLY Medicines can help you stop smoking and decrease the urge to smoke. Combining medicine with the above behavioral methods and support can greatly increase your chances of successfully quitting smoking.  Nicotine replacement therapy helps deliver nicotine to your body without the negative effects and risks of smoking. Nicotine replacement therapy includes nicotine gum, lozenges, inhalers, nasal sprays, and skin patches. Some may be available over-the-counter and others require a prescription.  Antidepressant medicine helps people abstain from smoking, but how this works is unknown. This medicine is available by prescription.  Nicotinic receptor partial agonist medicine simulates the effect of nicotine in your brain. This medicine is available by prescription. Ask your caregiver for advice about which medicines to use and how to use them based on your health history. Your caregiver will tell you what side effects to look out for if you choose to be on a medicine or therapy. Carefully read the information on the package. Do not use any other product containing nicotine while using a nicotine replacement product.  RELAPSE OR DIFFICULT SITUATIONS Most relapses occur within the first 3 months after quitting. Do not be discouraged if you start smoking again. Remember, most people try several times before finally quitting. You may have symptoms of withdrawal because your body is used to nicotine. You may crave cigarettes, be irritable, feel very hungry, cough often, get headaches, or have difficulty concentrating. The withdrawal symptoms are only  temporary. They are strongest when you first quit, but they will go away within 10 14 days. To reduce the chances of relapse, try to:  Avoid drinking alcohol. Drinking lowers your chances of successfully quitting.  Reduce the amount of caffeine you consume. Once you quit smoking, the amount of caffeine in your body increases and can give you symptoms, such as a rapid heartbeat, sweating, and anxiety.  Avoid smokers because they can make you want to smoke.  Do not let weight gain distract you. Many smokers will gain weight when they quit, usually less than 10 pounds.  Eat a healthy diet and stay active. You can always lose the weight gained after you quit.  Find ways to improve your mood other than smoking. FOR MORE INFORMATION  www.smokefree.gov  Document Released: 12/04/2001 Document Revised: 06/10/2012 Document Reviewed: 03/20/2012 [place] [facility] Patient Information 2014 [place], [place]. Insomnia Insomnia is frequent trouble falling and/or staying asleep. Insomnia can be a long term problem or a short term problem. Both are common. Insomnia can be a short term problem when the wakefulness is related to a certain stress or worry. Long term insomnia is often related to ongoing stress during waking hours and/or poor sleeping habits. Overtime, sleep deprivation itself can make the problem worse. Every little thing feels more severe because you are overtired and your ability to cope is decreased. CAUSES   Stress, anxiety, and depression.  Poor sleeping habits.  Distractions such as TV in the bedroom.  Naps close to bedtime.  Engaging in emotionally charged conversations before bed.  Technical reading before sleep.  Alcohol and other sedatives. They may make the problem worse. They can hurt normal sleep patterns and normal dream activity.  Stimulants such as caffeine for several hours prior to bedtime.  Pain syndromes and shortness of breath can cause insomnia.  Exercise late at  night.  Changing time zones may cause sleeping problems (jet lag). It is sometimes helpful to have someone observe your sleeping patterns. They should look for periods of not breathing during the night (sleep apnea). They should also look to see how long those periods last. If you live alone or observers are uncertain, you can also be observed at a sleep clinic where your sleep patterns will be professionally monitored. Sleep apnea requires a checkup and treatment. Give your caregivers your medical history. Give your caregivers observations your family has made about your sleep.  SYMPTOMS   Not feeling rested in the morning.  Anxiety and restlessness at bedtime.  Difficulty falling and staying asleep. TREATMENT   Your caregiver may prescribe treatment for an underlying medical disorders. Your caregiver can give advice or help if you are using alcohol or other drugs for self-medication. Treatment of underlying problems will usually eliminate insomnia problems.  Medications can be prescribed for short time use. They are generally not recommended for lengthy use.  Over-the-counter sleep medicines are not recommended for lengthy use. They can be habit forming.  You can promote easier sleeping by making lifestyle changes such as:  Using relaxation techniques that help with breathing and reduce muscle tension.  Exercising earlier in the day.  Changing your diet and the time of your last meal. No night time snacks.  Establish a regular time to go to bed.  Counseling can help with stressful problems and worry.  Soothing music and white noise may be helpful if there are background noises you cannot remove.  Stop tedious detailed work at least one hour before bedtime. HOME CARE INSTRUCTIONS   Keep a diary. Inform your caregiver about your progress. This includes any medication side effects. See your caregiver regularly. Take note of:  Times when you are asleep.  Times when you are awake  during the night.  The quality of your sleep.  How you feel the next day. This information will help your caregiver care for you.  Get out of bed if you are still awake after 15 minutes. Read or do some quiet activity. Keep the lights down. Wait until you feel sleepy and go back to bed.  Keep regular sleeping and waking hours.  Avoid naps.  Exercise regularly.  Avoid distractions at bedtime. Distractions include watching television or engaging in any intense or detailed activity like attempting to balance the household checkbook.  Develop a bedtime ritual. Keep a familiar routine of bathing, brushing your teeth, climbing into bed at the same time each night, listening to soothing music. Routines increase the success of falling to sleep faster.  Use relaxation techniques. This can be using breathing and muscle tension release routines. It can also include visualizing peaceful scenes. You can also help control troubling or intruding thoughts by keeping your mind occupied with boring or repetitive thoughts like the old concept of counting sheep. You can make it more creative like imagining planting one beautiful flower after another in your backyard garden.  During your day, work to eliminate stress. When this is not possible use some of the previous suggestions to help reduce the anxiety that accompanies stressful situations. MAKE SURE YOU:   Understand these instructions.  Will watch your condition.  Will get help right away if you are not doing well or get worse. Document Released: 12/07/2000 Document Revised: 03/03/2012 Document Reviewed: 01/07/2008 [facility] Patient Information 2014 [place], [place]. Depression, Adult Depression refers to feeling sad, low, down in the dumps, blue, gloomy, or empty. In general, there are two kinds of depression: 1. Depression that we all experience from time to time because of upsetting life experiences, including the loss of a job or the ending of a  relationship (normal sadness or normal grief). This kind of depression is considered normal, is short lived, and resolves within a few days to 2 weeks. (Depression experienced after the loss of a loved one is called bereavement. Bereavement often lasts longer than 2 weeks but normally gets better with time.) 2. Clinical depression, which lasts longer than normal sadness or normal grief or interferes with your ability to function at home, at work, and in school. It also interferes with your personal relationships. It affects almost every aspect of your life. Clinical depression is an illness. Symptoms of depression also can be caused by conditions other than normal sadness and grief or clinical depression. Examples of these conditions are listed as follows:  Physical illness Some physical illnesses, including underactive thyroid gland (hypothyroidism), severe anemia, specific types of cancer, diabetes, uncontrolled seizures, heart and lung problems, strokes, and chronic pain are commonly associated with symptoms of depression.  Side effects of some prescription medicine In some people, certain types of prescription medicine can cause symptoms of depression.  Substance abuse Abuse of alcohol and illicit drugs can cause symptoms of depression. SYMPTOMS Symptoms of normal sadness and normal grief include the following:  Feeling sad or crying for short periods of time.  Not caring about anything (apathy).  Difficulty sleeping or sleeping too much.  No longer able to enjoy the things you used to enjoy.  Desire to be by oneself all the time (social isolation).  Lack of energy or motivation.  Difficulty concentrating or remembering.  Change in appetite or weight.  Restlessness or agitation. Symptoms of clinical depression include the same symptoms of normal sadness or normal grief and also the following symptoms:  Feeling sad or crying all the time.  Feelings of guilt or  worthlessness.  Feelings of hopelessness or helplessness.  Thoughts of suicide or the desire to harm yourself (suicidal ideation).  Loss of touch with reality (psychotic symptoms). Seeing or hearing things that are not real (hallucinations) or having false beliefs about your life or the people  around you (delusions and paranoia). DIAGNOSIS  The diagnosis of clinical depression usually is based on the severity and duration of the symptoms. Your caregiver also will ask you questions about your medical history and substance use to find out if physical illness, use of prescription medicine, or substance abuse is causing your depression. Your caregiver also may order blood tests. TREATMENT  Typically, normal sadness and normal grief do not require treatment. However, sometimes antidepressant medicine is prescribed for bereavement to ease the depressive symptoms until they resolve. The treatment for clinical depression depends on the severity of your symptoms but typically includes antidepressant medicine, counseling with a mental health professional, or a combination of both. Your caregiver will help to determine what treatment is best for you. Depression caused by physical illness usually goes away with appropriate medical treatment of the illness. If prescription medicine is causing depression, talk with your caregiver about stopping the medicine, decreasing the dose, or substituting another medicine. Depression caused by abuse of alcohol or illicit drugs abuse goes away with abstinence from these substances. Some adults need professional help in order to stop drinking or using drugs. SEEK IMMEDIATE CARE IF:  You have thoughts about hurting yourself or others.  You lose touch with reality (have psychotic symptoms).  You are taking medicine for depression and have a serious side effect. FOR MORE INFORMATION National Alliance on Mental Illness: www.nami.[employer]:  http://www.maynard.net/ Document Released: 12/07/2000 Document Revised: 06/10/2012 Document Reviewed: 03/10/2012 [facility] Patient Information 2014 [place], [place].

## 2013-08-28 NOTE — Progress Notes (Signed)
  Subjective:    Patient ID: Sandra Pittman, female    DOB: [birth year], 32 y.o.   MRN: [id number]  HPI  Here today for CPE.  Does not need pap.  Reports finished school.  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  In a relationship with man with 2 kids.  Shared custody.  Trying to lose weight.  Still smoking.  Reports continued problems with mood and depression.  Has angry outbursts, unsure why.  No luck previously with Zoloft, Celexa or Well-butrin. Also continues to have issues with insomnia.  Has trouble falling asleep.  Long-term problem, previously tried anti-histamines, ambien and doxepin with either minimal results or intolerable side effects. Reports regular cycles.  Past Medical History  Diagnosis Date  . Depression   . Hypertension    Past Surgical History  Procedure Laterality Date  . Diagnostic laparoscopy    . Wisdom tooth extraction     No current outpatient prescriptions on file prior to visit.   No current facility-administered medications on file prior to visit.   No Known Allergies History   Social History  . Marital Status: Single    Spouse Name: N/A    Number of Children: N/A  . Years of Education: N/A   Occupational History  . Not on file.   Social History Main Topics  . Smoking status: Current Every Day Smoker -- .3 years    Types: Cigarettes, Cigars  . Smokeless tobacco: Never Used  . Alcohol Use: Not on file  . Drug Use: Not on file  . Sexual Activity: Not on file   Other Topics Concern  . Not on file   Social History Narrative  . No narrative on file   Family History  Problem Relation Age of Onset  . Diabetes Mother      Review of Systems  Constitutional: Negative for fever and chills.  HENT: Positive for sore throat. Negative for congestion.   Eyes: Negative for visual disturbance.  Respiratory: Positive for wheezing. Negative for shortness of breath.   Cardiovascular: Negative for chest pain and leg swelling.  Gastrointestinal: Negative for  nausea, vomiting, abdominal pain, diarrhea and constipation.  Endocrine: Negative for polydipsia and polyuria.  Genitourinary: Negative for urgency, vaginal bleeding, vaginal discharge, menstrual problem and dyspareunia.  Skin: Negative for rash.  Neurological: Negative for seizures and headaches.  Psychiatric/Behavioral: Positive for sleep disturbance and dysphoric mood. Negative for suicidal ideas and hallucinations.       Objective:   Physical Exam  Vitals reviewed. Constitutional: She is oriented to person, place, and time. She appears well-developed and well-nourished. No distress.  HENT:  Head: Normocephalic and atraumatic.  Mouth/Throat: No oropharyngeal exudate.  Eyes: No scleral icterus.  Neck: Neck supple. No thyromegaly present.  Cardiovascular: Normal rate and regular rhythm.   No murmur heard. Pulmonary/Chest: Effort normal and breath sounds normal.  Abdominal: Soft. Bowel sounds are normal. She exhibits no mass. There is no tenderness.  Musculoskeletal: Normal range of motion. She exhibits no edema.  Neurological: She is alert and oriented to person, place, and time.  Skin: Skin is warm and dry. No rash noted.  Psychiatric: She has a normal mood and affect. Her behavior is normal.          Assessment & Plan:  No need for pap Normal labs 1 year ago-repeat q 5 years. See problem list.

## 2013-08-28 NOTE — Assessment & Plan Note (Signed)
Needs to try to quit smoking.

## 2013-08-28 NOTE — Assessment & Plan Note (Signed)
Continue diet and weight loss efforts.  Exercise.

## 2013-08-28 NOTE — Assessment & Plan Note (Signed)
BP well controlled--continue current treatment.

## 2013-08-28 NOTE — Assessment & Plan Note (Signed)
Continues to have trouble with this problem.  Has tried multiple different medications.  Unable to tolerate side effects or just not helpful.

## 2013-08-28 NOTE — Assessment & Plan Note (Signed)
Did not tolerate Zoloft, well-butrin or celexa.  Trial of effexor.  May rtn in 4 wks for dosage adjustment.

## 2013-09-29 ENCOUNTER — Ambulatory Visit: Payer: BC Managed Care – PPO | Admitting: Family Medicine

## 2013-10-07 ENCOUNTER — Encounter: Payer: Self-pay | Admitting: Family Medicine

## 2013-10-07 ENCOUNTER — Ambulatory Visit (INDEPENDENT_AMBULATORY_CARE_PROVIDER_SITE_OTHER): Payer: BC Managed Care – PPO | Admitting: Family Medicine

## 2013-10-07 VITALS — BP 144/87 | HR 87 | Temp 98.4°F | Wt 208.0 lb

## 2013-10-07 DIAGNOSIS — Z113 Encounter for screening for infections with a predominantly sexual mode of transmission: Secondary | ICD-10-CM

## 2013-10-07 DIAGNOSIS — B354 Tinea corporis: Secondary | ICD-10-CM

## 2013-10-07 DIAGNOSIS — F3289 Other specified depressive episodes: Secondary | ICD-10-CM

## 2013-10-07 DIAGNOSIS — Z23 Encounter for immunization: Secondary | ICD-10-CM

## 2013-10-07 DIAGNOSIS — F329 Major depressive disorder, single episode, unspecified: Secondary | ICD-10-CM

## 2013-10-07 LAB — HIV ANTIBODY (ROUTINE TESTING W REFLEX): HIV: NONREACTIVE

## 2013-10-07 MED ORDER — NYSTATIN 100000 UNIT/GM EX POWD: Freq: Four times a day (QID) | CUTANEOUS | Status: DC

## 2013-10-07 MED ORDER — VENLAFAXINE HCL ER 75 MG PO CP24: 75.0000 mg | ORAL_CAPSULE | Freq: Every day | ORAL | Status: DC

## 2013-10-07 NOTE — Assessment & Plan Note (Signed)
Increase medication to 75 mg daily--f/u in 3 mos

## 2013-10-07 NOTE — Patient Instructions (Addendum)
Depression, Adult Depression refers to feeling sad, low, down in the dumps, blue, gloomy, or empty. In general, there are two kinds of depression: 1. Depression that we all experience from time to time because of upsetting life experiences, including the loss of a job or the ending of a relationship (normal sadness or normal grief). This kind of depression is considered normal, is short lived, and resolves within a few days to 2 weeks. (Depression experienced after the loss of a loved one is called bereavement. Bereavement often lasts longer than 2 weeks but normally gets better with time.) 2. Clinical depression, which lasts longer than normal sadness or normal grief or interferes with your ability to function at home, at work, and in school. It also interferes with your personal relationships. It affects almost every aspect of your life. Clinical depression is an illness. Symptoms of depression also can be caused by conditions other than normal sadness and grief or clinical depression. Examples of these conditions are listed as follows:  Physical illness Some physical illnesses, including underactive thyroid gland (hypothyroidism), severe anemia, specific types of cancer, diabetes, uncontrolled seizures, heart and lung problems, strokes, and chronic pain are commonly associated with symptoms of depression.  Side effects of some prescription medicine In some people, certain types of prescription medicine can cause symptoms of depression.  Substance abuse Abuse of alcohol and illicit drugs can cause symptoms of depression. SYMPTOMS Symptoms of normal sadness and normal grief include the following:  Feeling sad or crying for short periods of time.  Not caring about anything (apathy).  Difficulty sleeping or sleeping too much.  No longer able to enjoy the things you used to enjoy.  Desire to be by oneself all the time (social isolation).  Lack of energy or motivation.  Difficulty  concentrating or remembering.  Change in appetite or weight.  Restlessness or agitation. Symptoms of clinical depression include the same symptoms of normal sadness or normal grief and also the following symptoms:  Feeling sad or crying all the time.  Feelings of guilt or worthlessness.  Feelings of hopelessness or helplessness.  Thoughts of suicide or the desire to harm yourself (suicidal ideation).  Loss of touch with reality (psychotic symptoms). Seeing or hearing things that are not real (hallucinations) or having false beliefs about your life or the people around you (delusions and paranoia). DIAGNOSIS  The diagnosis of clinical depression usually is based on the severity and duration of the symptoms. Your caregiver also will ask you questions about your medical history and substance use to find out if physical illness, use of prescription medicine, or substance abuse is causing your depression. Your caregiver also may order blood tests. TREATMENT  Typically, normal sadness and normal grief do not require treatment. However, sometimes antidepressant medicine is prescribed for bereavement to ease the depressive symptoms until they resolve. The treatment for clinical depression depends on the severity of your symptoms but typically includes antidepressant medicine, counseling with a mental health professional, or a combination of both. Your caregiver will help to determine what treatment is best for you. Depression caused by physical illness usually goes away with appropriate medical treatment of the illness. If prescription medicine is causing depression, talk with your caregiver about stopping the medicine, decreasing the dose, or substituting another medicine. Depression caused by abuse of alcohol or illicit drugs abuse goes away with abstinence from these substances. Some adults need professional help in order to stop drinking or using drugs. SEEK IMMEDIATE CARE IF:  You have   thoughts  about hurting yourself or others.  You lose touch with reality (have psychotic symptoms).  You are taking medicine for depression and have a serious side effect. FOR MORE INFORMATION National Alliance on Mental Illness: www.nami.[employer]: http://www.maynard.net/ Document Released: 12/07/2000 Document Revised: 06/10/2012 Document Reviewed: 03/10/2012 [facility] Patient Information 2014 [place], [place]. Body Ringworm Ringworm (tinea corporis) is a fungal infection of the skin on the body. This infection is not caused by worms, but is actually caused by a fungus. Fungus normally lives on the top of your skin and can be useful. However, in the case of ringworms, the fungus grows out of control and causes a skin infection. It can involve any area of skin on the body and can spread easily from one person to another (contagious). Ringworm is a common problem for children, but it can affect adults as well. Ringworm is also often found in athletes, especially wrestlers who share equipment and mats.  CAUSES  Ringworm of the body is caused by a fungus called dermatophyte. It can spread by:  Touchingother people who are infected.  Touchinginfected pets.  Touching or sharingobjects that have been in contact with the infected person or pet (hats, combs, towels, clothing, sports equipment). SYMPTOMS   Itchy, raised red spots and bumps on the skin.  Ring-shaped rash.  Redness near the border of the rash with a clear center.  Dry and scaly skin on or around the rash. Not every person develops a ring-shaped rash. Some develop only the red, scaly patches. DIAGNOSIS  Most often, ringworm can be diagnosed by performing a skin exam. Your caregiver may choose to take a skin scraping from the affected area. The sample will be examined under the microscope to see if the fungus is present.  TREATMENT  Body ringworm may be treated with a topical antifungal cream or ointment.  Sometimes, an antifungal shampoo that can be used on your body is prescribed. You may be prescribed antifungal medicines to take by mouth if your ringworm is severe, keeps coming back, or lasts a long time.  HOME CARE INSTRUCTIONS   Only take over-the-counter or prescription medicines as directed by your caregiver.  Wash the infected area and dry it completely before applying yourcream or ointment.  When using antifungal shampoo to treat the ringworm, leave the shampoo on the body for 3 5 minutes before rinsing.   Wear loose clothing to stop clothes from rubbing and irritating the rash.  Wash or change your bed sheets every night while you have the rash.  Have your pet treated by your veterinarian if it has the same infection. To prevent ringworm:   Practice good hygiene.  Wear sandals or shoes in public places and showers.  Do not share personal items with others.  Avoid touching red patches of skin on other people.  Avoid touching pets that have bald spots or wash your hands after doing so. SEEK MEDICAL CARE IF:   Your rash continues to spread after 7 days of treatment.  Your rash is not gone in 4 weeks.  The area around your rash becomes red, warm, tender, and swollen. Document Released: 12/07/2000 Document Revised: 09/03/2012 Document Reviewed: 06/23/2012 [facility] Patient Information 2014 [place], [place].

## 2013-10-07 NOTE — Progress Notes (Signed)
  Subjective:    Patient ID: Sandra Pittman, female    DOB: 1981/09/28, 32 y.o.   MRN: [id number]  HPI  Returns for f/u depression.  On Effexor at 37.5 mg, some fatigue, but mood is somewhat improved and states she is "maintaining".   Also has rash between her thighs has tried lotrimin OTC and powder which is not helping.   Has itchy rash on wrist. 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  Review of Systems  Constitutional: Negative for fever, chills and fatigue.  Respiratory: Negative for shortness of breath.   Gastrointestinal: Negative for abdominal pain.  Musculoskeletal: Negative for joint swelling.  Neurological: Negative for headaches.       Objective:   Physical Exam  Vitals reviewed. Constitutional: She is oriented to person, place, and time. She appears well-developed and well-nourished.  HENT:  Head: Normocephalic and atraumatic.  Eyes: No scleral icterus.  Neck: Neck supple.  Cardiovascular: Normal rate and regular rhythm.   No murmur heard. Abdominal: Soft.  Neurological: She is alert and oriented to person, place, and time.  Skin: Rash noted. Rash is maculopapular.  Hyperpigmented areas with slight pinkish tint at edges.  Psychiatric: She has a normal mood and affect. Her behavior is normal.          Assessment & Plan:

## 2013-10-07 NOTE — Assessment & Plan Note (Signed)
Trial of Nystatin powder.  

## 2014-03-09 ENCOUNTER — Ambulatory Visit (INDEPENDENT_AMBULATORY_CARE_PROVIDER_SITE_OTHER): Payer: Self-pay | Admitting: Family Medicine

## 2014-03-09 ENCOUNTER — Encounter: Payer: Self-pay | Admitting: Family Medicine

## 2014-03-09 VITALS — BP 126/78 | HR 94 | Temp 98.9°F | Ht 63.0 in | Wt 214.0 lb

## 2014-03-09 DIAGNOSIS — N764 Abscess of vulva: Secondary | ICD-10-CM | POA: Insufficient documentation

## 2014-03-09 MED ORDER — DOXYCYCLINE HYCLATE 100 MG PO TABS: 100.0000 mg | ORAL_TABLET | Freq: Two times a day (BID) | ORAL | Status: DC

## 2014-03-09 MED ORDER — TRAMADOL HCL 50 MG PO TABS: 50.0000 mg | ORAL_TABLET | Freq: Three times a day (TID) | ORAL | Status: DC | PRN

## 2014-03-09 MED ORDER — BACITRACIN 500 UNIT/GM EX OINT: 1.0000 "application " | TOPICAL_OINTMENT | Freq: Two times a day (BID) | CUTANEOUS | Status: DC

## 2014-03-09 NOTE — Patient Instructions (Addendum)
It has been a pleasure to see you today. Please take the medications as prescribed. Sitz baths twice a day an apply ointment after that  Make a follow up appointment if pain does not resolve, lesion does not improve or you develop other symptoms

## 2014-03-09 NOTE — Progress Notes (Signed)
Family Medicine Office Visit Note   Subjective:   Patient ID: Sandra Pittman, female  DOB: 06-Feb-1981, 33 y.o.. MRN: [id number]   Pt that comes today for same day appointment complaining of painful mass on her left labia majora. She reports has past medical history significant for recurrent boils. This episode started  2 days ago with te beginning of her menses. Denies fever, chills, nausea or other systemic symptoms.   Review of Systems:  Per HPI.  Objective:   Physical Exam: Gen:  NAD HEENT: Moist mucous membranes  CV: Regular rate and rhythm, no murmurs. PULM: normal effort Vulva and perianal area: exterior aspect of left labia majora with 2cm area of induration. No erythema or drainage, tender to palpation. No surrounded edema or adenopathies.  Blood present in introitus since pt is with her menses.   Assessment & Plan:

## 2014-03-09 NOTE — Assessment & Plan Note (Signed)
No fluctuation. No at the point of drainage yet. Will start oral and topical antibiotic as well as sitz baths with warm water to favor maturation of abscess and drainage. May need I&D, f/u as needed.

## 2014-03-15 ENCOUNTER — Ambulatory Visit (INDEPENDENT_AMBULATORY_CARE_PROVIDER_SITE_OTHER): Payer: Self-pay | Admitting: Family Medicine

## 2014-03-15 ENCOUNTER — Encounter: Payer: Self-pay | Admitting: Family Medicine

## 2014-03-15 VITALS — BP 152/100 | HR 82 | Temp 98.3°F | Ht 62.0 in | Wt 216.0 lb

## 2014-03-15 DIAGNOSIS — L732 Hidradenitis suppurativa: Secondary | ICD-10-CM

## 2014-03-15 DIAGNOSIS — G4700 Insomnia, unspecified: Secondary | ICD-10-CM

## 2014-03-15 DIAGNOSIS — B354 Tinea corporis: Secondary | ICD-10-CM

## 2014-03-15 DIAGNOSIS — F329 Major depressive disorder, single episode, unspecified: Secondary | ICD-10-CM

## 2014-03-15 DIAGNOSIS — N764 Abscess of vulva: Secondary | ICD-10-CM

## 2014-03-15 DIAGNOSIS — F3289 Other specified depressive episodes: Secondary | ICD-10-CM

## 2014-03-15 MED ORDER — NYSTATIN 100000 UNIT/GM EX POWD: Freq: Four times a day (QID) | CUTANEOUS | Status: DC

## 2014-03-15 MED ORDER — DOXEPIN HCL 10 MG/ML PO CONC: 5.0000 mg | Freq: Every day | ORAL | Status: DC

## 2014-03-15 MED ORDER — VENLAFAXINE HCL 75 MG PO TABS: 75.0000 mg | ORAL_TABLET | Freq: Two times a day (BID) | ORAL | Status: DC

## 2014-03-15 NOTE — Patient Instructions (Signed)
Body Ringworm Ringworm (tinea corporis) is a fungal infection of the skin on the body. This infection is not caused by worms, but is actually caused by a fungus. Fungus normally lives on the top of your skin and can be useful. However, in the case of ringworms, the fungus grows out of control and causes a skin infection. It can involve any area of skin on the body and can spread easily from one person to another (contagious). Ringworm is a common problem for children, but it can affect adults as well. Ringworm is also often found in athletes, especially wrestlers who share equipment and mats.  CAUSES  Ringworm of the body is caused by a fungus called dermatophyte. It can spread by:  Touchingother people who are infected.  Touchinginfected pets.  Touching or sharingobjects that have been in contact with the infected person or pet (hats, combs, towels, clothing, sports equipment). SYMPTOMS   Itchy, raised red spots and bumps on the skin.  Ring-shaped rash.  Redness near the border of the rash with a clear center.  Dry and scaly skin on or around the rash. Not every person develops a ring-shaped rash. Some develop only the red, scaly patches. DIAGNOSIS  Most often, ringworm can be diagnosed by performing a skin exam. Your caregiver may choose to take a skin scraping from the affected area. The sample will be examined under the microscope to see if the fungus is present.  TREATMENT  Body ringworm may be treated with a topical antifungal cream or ointment. Sometimes, an antifungal shampoo that can be used on your body is prescribed. You may be prescribed antifungal medicines to take by mouth if your ringworm is severe, keeps coming back, or lasts a long time.  HOME CARE INSTRUCTIONS   Only take over-the-counter or prescription medicines as directed by your caregiver.  Wash the infected area and dry it completely before applying yourcream or ointment.  When using antifungal shampoo to  treat the ringworm, leave the shampoo on the body for 3 5 minutes before rinsing.   Wear loose clothing to stop clothes from rubbing and irritating the rash.  Wash or change your bed sheets every night while you have the rash.  Have your pet treated by your veterinarian if it has the same infection. To prevent ringworm:   Practice good hygiene.  Wear sandals or shoes in public places and showers.  Do not share personal items with others.  Avoid touching red patches of skin on other people.  Avoid touching pets that have bald spots or wash your hands after doing so. SEEK MEDICAL CARE IF:   Your rash continues to spread after 7 days of treatment.  Your rash is not gone in 4 weeks.  The area around your rash becomes red, warm, tender, and swollen. Document Released: 12/07/2000 Document Revised: 09/03/2012 Document Reviewed: 06/23/2012 [facility] Patient Information 2014 [place], [place].  Hidradenitis Suppurativa, Sweat Gland Abscess Hidradenitis suppurativa is a long lasting (chronic), uncommon disease of the sweat glands. With this, boil-like lumps and scarring develop in the groin, some times under the arms (axillae), and under the breasts. It may also uncommonly occur behind the ears, in the crease of the buttocks, and around the genitals.  CAUSES  The cause is from a blocking of the sweat glands. They then become infected. It may cause drainage and odor. It is not contagious. So it cannot be given to someone else. It most often shows up in puberty (about 10 to 32 years  of age). But it may happen much later. It is similar to acne which is a disease of the sweat glands. This condition is slightly more common in African-Americans and women. SYMPTOMS   Hidradenitis usually starts as one or more red, tender, swellings in the groin or under the arms (axilla).  Over a period of hours to days the lesions get larger. They often open to the skin surface, draining clear to yellow-colored  fluid.  The infected area heals with scarring. DIAGNOSIS  Your caregiver makes this diagnosis by looking at you. Sometimes cultures (growing germs on plates in the lab) may be taken. This is to see what germ (bacterium) is causing the infection.  TREATMENT   Topical germ killing medicine applied to the skin (antibiotics) are the treatment of choice. Antibiotics taken by mouth (systemic) are sometimes needed when the condition is getting worse or is severe.  Avoid tight-fitting clothing which traps moisture in.  Dirt does not cause hidradenitis and it is not caused by poor hygiene.  Involved areas should be cleaned daily using an antibacterial soap. Some patients find that the liquid form of Lever 2000, applied to the involved areas as a lotion after bathing, can help reduce the odor related to this condition.  Sometimes surgery is needed to drain infected areas or remove scarred tissue. Removal of large amounts of tissue is used only in severe cases.  Birth control pills may be helpful.  Oral retinoids (vitamin A derivatives) for 6 to 12 months which are effective for acne may also help this condition.  Weight loss will improve but not cure hidradenitis. It is made worse by being overweight. But the condition is not caused by being overweight.  This condition is more common in people who have had acne.  It may become worse under stress. There is no medical cure for hidradenitis. It can be controlled, but not cured. The condition usually continues for years with periods of getting worse and getting better (remission). Document Released: 07/24/2004 Document Revised: 03/03/2012 Document Reviewed: 08/09/2008 [facility] Patient Information 2014 [place], [place].

## 2014-03-15 NOTE — Assessment & Plan Note (Signed)
Nystatin, keep area dry--use anti-perspirant.

## 2014-03-15 NOTE — Progress Notes (Signed)
    Subjective:    Patient ID: Sandra Pittman is a 33 y.o. female presenting with Abscess  on 03/15/2014  HPI: Gets boils and hidradenitis.  1st 2 have drained. Has one more.  Frequent issue.  Also still having rash and itching in crotch area.  Out of Nystatin, which helped.  Still with insomnia.  Would like another trial of doxepin but lower dose.  Also needs more effexor.  She has lost her insurance.  Review of Systems  Constitutional: Negative for fever and chills.  Respiratory: Negative for shortness of breath.   Cardiovascular: Negative for chest pain.  Gastrointestinal: Negative for nausea, vomiting and abdominal pain.  Genitourinary: Negative for dysuria.  Skin: Negative for rash.      Objective:    BP 152/100  Pulse 82  Temp(Src) 98.3 F (36.8 C) (Oral)  Ht 5\' 2"  (1.575 m)  Wt 216 lb (97.977 kg)  BMI 39.50 kg/m2  LMP 03/09/2014 Physical Exam  Constitutional: She is oriented to person, place, and time. She appears well-developed and well-nourished. No distress.  HENT:  Head: Normocephalic and atraumatic.  Eyes: No scleral icterus.  Neck: Neck supple.  Cardiovascular: Normal rate.   Pulmonary/Chest: Effort normal.  Abdominal: Soft.  Genitourinary:    There is tenderness on the right labia. There is tenderness on the left labia.  Macular rash, velvety, irregular borders in the intertriginous areas.  Neurological: She is alert and oriented to person, place, and time.  Skin: Skin is warm and dry.  Psychiatric: She has a normal mood and affect.        Assessment & Plan:   Left genital labial abscess instructions given--sitz baths, and anti-microbial soap  Hidradenitis Discussed treatment options-  DEPRESSION Refilled Effexor  INSOMNIA-SLEEP DISORDER-UNSPEC Trial of low dose doxepin  Tinea corporis Nystatin, keep area dry--use anti-perspirant.    Return in about 3 months (around 06/15/2014), or if symptoms worsen or fail to improve, for a  follow-up.

## 2014-03-15 NOTE — Assessment & Plan Note (Signed)
Trial of low dose doxepin

## 2014-03-15 NOTE — Assessment & Plan Note (Signed)
Discussed treatment options.

## 2014-03-15 NOTE — Assessment & Plan Note (Signed)
Refilled Effexor 

## 2014-03-15 NOTE — Assessment & Plan Note (Signed)
instructions given--sitz baths, and anti-microbial soap

## 2014-07-03 ENCOUNTER — Emergency Department (HOSPITAL_COMMUNITY): Payer: Self-pay

## 2014-07-03 ENCOUNTER — Encounter (HOSPITAL_COMMUNITY): Payer: Self-pay | Admitting: Emergency Medicine

## 2014-07-03 ENCOUNTER — Emergency Department (HOSPITAL_COMMUNITY)
Admission: EM | Admit: 2014-07-03 | Discharge: 2014-07-03 | Disposition: A | Discharge status: Home / Self Care | Payer: Self-pay | Attending: Emergency Medicine | Admitting: Emergency Medicine

## 2014-07-03 DIAGNOSIS — O99330 Smoking (tobacco) complicating pregnancy, unspecified trimester: Secondary | ICD-10-CM | POA: Insufficient documentation

## 2014-07-03 DIAGNOSIS — O169 Unspecified maternal hypertension, unspecified trimester: Secondary | ICD-10-CM | POA: Insufficient documentation

## 2014-07-03 DIAGNOSIS — F3289 Other specified depressive episodes: Secondary | ICD-10-CM | POA: Insufficient documentation

## 2014-07-03 DIAGNOSIS — F329 Major depressive disorder, single episode, unspecified: Secondary | ICD-10-CM | POA: Insufficient documentation

## 2014-07-03 DIAGNOSIS — O99340 Other mental disorders complicating pregnancy, unspecified trimester: Secondary | ICD-10-CM | POA: Insufficient documentation

## 2014-07-03 DIAGNOSIS — O039 Complete or unspecified spontaneous abortion without complication: Secondary | ICD-10-CM | POA: Insufficient documentation

## 2014-07-03 DIAGNOSIS — Z79899 Other long term (current) drug therapy: Secondary | ICD-10-CM | POA: Insufficient documentation

## 2014-07-03 LAB — BASIC METABOLIC PANEL
ANION GAP: 12 (ref 5–15)
BUN: 7 mg/dL (ref 6–23)
CALCIUM: 9.1 mg/dL (ref 8.4–10.5)
CO2: 23 mEq/L (ref 19–32)
CREATININE: 0.63 mg/dL (ref 0.50–1.10)
Chloride: 103 mEq/L (ref 96–112)
GFR calc Af Amer: >90 mL/min (ref >90)
Glucose, Bld: 113 mg/dL — ABNORMAL HIGH (ref 70–99)
Potassium: 3.8 mEq/L (ref 3.7–5.3)
Sodium: 138 mEq/L (ref 137–147)

## 2014-07-03 LAB — URINALYSIS, ROUTINE W REFLEX MICROSCOPIC
Bilirubin Urine: NEGATIVE
Glucose, UA: NEGATIVE mg/dL
Ketones, ur: NEGATIVE mg/dL
Nitrite: NEGATIVE
PH: 7.5 (ref 5.0–8.0)
Protein, ur: >300 mg/dL — AB
SPECIFIC GRAVITY, URINE: 1.025 (ref 1.005–1.030)
Urobilinogen, UA: 0.2 mg/dL (ref 0.0–1.0)

## 2014-07-03 LAB — CBC
HCT: 30.3 % — ABNORMAL LOW (ref 36.0–46.0)
HEMOGLOBIN: 10.3 g/dL — AB (ref 12.0–15.0)
MCH: 30.4 pg (ref 26.0–34.0)
MCHC: 34 g/dL (ref 30.0–36.0)
MCV: 89.4 fL (ref 78.0–100.0)
Platelets: 332 10*3/uL (ref 150–400)
RBC: 3.39 MIL/uL — AB (ref 3.87–5.11)
RDW: 14.2 % (ref 11.5–15.5)
WBC: 10.7 10*3/uL — ABNORMAL HIGH (ref 4.0–10.5)

## 2014-07-03 LAB — WET PREP, GENITAL
CLUE CELLS WET PREP: NONE SEEN
TRICH WET PREP: NONE SEEN
YEAST WET PREP: NONE SEEN

## 2014-07-03 LAB — URINE MICROSCOPIC-ADD ON

## 2014-07-03 LAB — ABO/RH: ABO/RH(D): O POS

## 2014-07-03 LAB — POC URINE PREG, ED: Preg Test, Ur: POSITIVE — AB

## 2014-07-03 LAB — HCG, QUANTITATIVE, PREGNANCY: hCG, Beta Chain, Quant, S: 10196 m[IU]/mL — ABNORMAL HIGH (ref <5)

## 2014-07-03 MED ORDER — HYDROCODONE-ACETAMINOPHEN 5-325 MG PO TABS
1.0000 | ORAL_TABLET | Freq: Once | ORAL | Status: AC
  Administered 2014-07-03: 1 via ORAL
  Filled 2014-07-03: qty 1

## 2014-07-03 NOTE — ED Provider Notes (Signed)
Medical screening examination/treatment/procedure(s) were performed by non-physician practitioner and as supervising physician I was immediately available for consultation/collaboration.    Atalya Dano D Arica Bevilacqua, MD 07/03/14 1935 

## 2014-07-03 NOTE — ED Provider Notes (Signed)
CSN: [id number]     Arrival date & time 07/03/14  0200 History   First MD Initiated Contact with Patient 07/03/14 0343     Chief Complaint  Patient presents with  . Vaginal Bleeding   HPI    History provided by the patient. Patient is a 33 year old female with history of hypertension and depression who presents with abnormal vaginal bleeding. Patient reports a history of heavy and irregular menstrual cycles in the past. This was sometimes improved while on birth control pills. She has not been on any birth control recently. She began having a menstrual period 3 weeks ago and has had persistent bleeding daily. She felt like her menstrual cycle was finishing yesterday with very small amounts of spotting however she awoke around midnight to 1 AM with large amount of blood in the bed. She also began passing large blood clots. She has had slight pelvic cramping and pain. Does not think she is pregnant. Denies any lightheadedness, near syncope or shortness of breath. No other aggravating or alleviating factors. No other associated symptoms.  Past Medical History  Diagnosis Date  . Depression   . Hypertension    Past Surgical History  Procedure Laterality Date  . Diagnostic laparoscopy    . Wisdom tooth extraction     Family History  Problem Relation Age of Onset  . Diabetes Mother    History  Substance Use Topics  . Smoking status: Current Every Day Smoker -- .3 years    Types: Cigarettes, Cigars  . Smokeless tobacco: Never Used  . Alcohol Use: Yes     Comment: occ   OB History   Grav Para Term Preterm Abortions TAB SAB Ect Mult Living                 Review of Systems  Constitutional: Negative for fever, chills and diaphoresis.  Gastrointestinal: Positive for abdominal pain. Negative for nausea, vomiting, diarrhea and constipation.  Genitourinary: Positive for vaginal bleeding and menstrual problem. Negative for dysuria, frequency, flank pain and vaginal discharge.  All other  systems reviewed and are negative.     Allergies  Review of patient's allergies indicates no known allergies.  Home Medications   Prior to Admission medications   Medication Sig Start Date End Date Taking? Authorizing Provider  hydrochlorothiazide (HYDRODIURIL) 25 MG tablet Take 1 tablet (25 mg total) by mouth daily. 08/27/13  Yes Reva Bores, MD  venlafaxine (EFFEXOR) 75 MG tablet Take 1 tablet (75 mg total) by mouth 2 (two) times daily. 03/15/14  Yes Reva Bores, MD   BP 130/75  Pulse 101  Temp(Src) 99.5 F (37.5 C) (Oral)  Resp 16  Ht 5\' 2"  (1.575 m)  Wt 209 lb (94.802 kg)  BMI 38.22 kg/m2  SpO2 100%  LMP 06/12/2014 Physical Exam  Nursing note and vitals reviewed. Constitutional: She is oriented to person, place, and time. She appears well-developed and well-nourished. No distress.  HENT:  Head: Normocephalic.  Mouth/Throat: Oropharynx is clear and moist.  Cardiovascular: Normal rate and regular rhythm.   No murmur heard. Pulmonary/Chest: Effort normal and breath sounds normal. No respiratory distress. She has no wheezes.  Abdominal: Soft. She exhibits no distension. There is tenderness in the suprapubic area. There is no rebound, no guarding, no tenderness at McBurney's point and negative Murphy's sign.  Mild suprapubic tenderness  Genitourinary:  Chaperone was present. Large amount of blood and large blood clots within the vaginal canal. Cervix was opened. There is a small amount  of products of conception.  Neurological: She is alert and oriented to person, place, and time.  Skin: Skin is warm and dry. No rash noted. No pallor.  Psychiatric: She has a normal mood and affect. Her behavior is normal.    ED Course  Procedures   COORDINATION OF CARE:  Nursing notes reviewed. Vital signs reviewed. Initial pt interview and examination performed.   Filed Vitals:   07/03/14 0218 07/03/14 0229  BP: 130/75   Pulse: 101   Temp: 99.5 F (37.5 C)   TempSrc: Oral    Resp: 16   Height:  5\' 2"  (1.575 m)  Weight:  209 lb (94.802 kg)  SpO2: 100%     4:14 AM-patient seen and evaluated. Patient well appearing. She reports similar history of heavy vaginal bleeding and irregular menstrual cycles. Has not been on any birth control recently. Difference in bleeding tonight where very large blood clots which she has never had. No lightheadedness or near-syncope.  4:45 AM patient with positive pregnancy test. Will begin early pregnancy bleeding and pain workup.  Pelvic exam with open cervical os and small amounts of products of conception.  6:00AM pt discussed in sign out with O[employer] Energyob Browning PA-C. He will follow lab results.  Treatment plan initiated: Medications  HYDROcodone-acetaminophen (NORCO/VICODIN) 5-325 MG per tablet 1 tablet (not administered)    Results for orders placed during the hospital encounter of 07/03/14  URINALYSIS, ROUTINE W REFLEX MICROSCOPIC      Result Value Ref Range   Color, Urine YELLOW  YELLOW   APPearance CLOUDY (*) CLEAR   Specific Gravity, Urine 1.025  1.005 - 1.030   pH 7.5  5.0 - 8.0   Glucose, UA NEGATIVE  NEGATIVE mg/dL   Hgb urine dipstick LARGE (*) NEGATIVE   Bilirubin Urine NEGATIVE  NEGATIVE   Ketones, ur NEGATIVE  NEGATIVE mg/dL   Protein, ur >161>300 (*) NEGATIVE mg/dL   Urobilinogen, UA 0.2  0.0 - 1.0 mg/dL   Nitrite NEGATIVE  NEGATIVE   Leukocytes, UA SMALL (*) NEGATIVE  URINE MICROSCOPIC-ADD ON      Result Value Ref Range   Squamous Epithelial / LPF RARE  RARE   WBC, UA 3-6  <3 WBC/hpf   RBC / HPF TOO NUMEROUS TO COUNT  <3 RBC/hpf   Bacteria, UA RARE  RARE  POC URINE PREG, ED      Result Value Ref Range   Preg Test, Ur POSITIVE (*) NEGATIVE         Imaging Review No results found.   EKG Interpretation None      MDM   Final diagnoses:  Spontaneous abortion        Sandra Sellereter S Sandra Dec, PA-C 07/03/14 [phone]

## 2014-07-03 NOTE — ED Notes (Signed)
Patient transported to Ultrasound 

## 2014-07-03 NOTE — ED Provider Notes (Signed)
Medical screening examination/treatment/procedure(s) were performed by non-physician practitioner and as supervising physician I was immediately available for consultation/collaboration.    Lelia Jons D Julianne Chamberlin, MD 07/03/14 1935 

## 2014-07-03 NOTE — ED Provider Notes (Signed)
6:15 AM Patient signed out to me by Dammen, PA-C.  History provided by the patient. Patient is a 33 year old female with history of hypertension and depression who presents with abnormal vaginal bleeding. Patient reports a history of heavy and irregular menstrual cycles in the past. This was sometimes improved while on birth control pills. She has not been on any birth control recently. She began having a menstrual period 3 weeks ago and has had persistent bleeding daily. She felt like her menstrual cycle was finishing yesterday with very small amounts of spotting however she awoke around midnight to 1 AM with large amount of blood in the bed. She also began passing large blood clots. She has had slight pelvic cramping and pain. Does not think she is pregnant. Denies any lightheadedness, near syncope or shortness of breath. No other aggravating or alleviating factors. No other associated symptoms.   Plan:  Follow-up on HCG.  If low, discharge as probable miscarriage.  If high, check [place].  6:54 [place] [address], will proceed with [place].  Discussed with Dr. Hyacinth Meeker. Results for orders placed during the hospital encounter of 07/03/14  WET PREP, GENITAL      Result Value Ref Range   Yeast Wet Prep HPF POC NONE SEEN  NONE SEEN   Trich, Wet Prep NONE SEEN  NONE SEEN   Clue Cells Wet Prep HPF POC NONE SEEN  NONE SEEN   WBC, Wet Prep HPF POC RARE (*) NONE SEEN  URINALYSIS, ROUTINE W REFLEX MICROSCOPIC      Result Value Ref Range   Color, Urine YELLOW  YELLOW   APPearance CLOUDY (*) CLEAR   Specific Gravity, Urine 1.025  1.005 - 1.030   pH 7.5  5.0 - 8.0   Glucose, UA NEGATIVE  NEGATIVE mg/dL   Hgb urine dipstick LARGE (*) NEGATIVE   Bilirubin Urine NEGATIVE  NEGATIVE   Ketones, ur NEGATIVE  NEGATIVE mg/dL   Protein, ur >161 (*) NEGATIVE mg/dL   Urobilinogen, UA 0.2  0.0 - 1.0 mg/dL   Nitrite NEGATIVE  NEGATIVE   Leukocytes, UA SMALL (*) NEGATIVE  CBC      Result Value Ref Range   WBC 10.7 (*) 4.0  - 10.5 K/uL   RBC 3.39 (*) 3.87 - 5.11 MIL/uL   Hemoglobin 10.3 (*) 12.0 - 15.0 g/dL   HCT 09.6 (*) 04.5 - 40.9 %   MCV 89.4  78.0 - 100.0 fL   MCH 30.4  26.0 - 34.0 pg   MCHC 34.0  30.0 - 36.0 g/dL   RDW 81.1  91.4 - 78.2 %   Platelets 332  150 - 400 K/uL  BASIC METABOLIC PANEL      Result Value Ref Range   Sodium 138  137 - 147 mEq/L   Potassium 3.8  3.7 - 5.3 mEq/L   Chloride 103  96 - 112 mEq/L   CO2 23  19 - 32 mEq/L   Glucose, Bld 113 (*) 70 - 99 mg/dL   BUN 7  6 - 23 mg/dL   Creatinine, Ser 9.56  0.50 - 1.10 mg/dL   Calcium 9.1  8.4 - 21.3 mg/dL   GFR calc non Af Amer >90  >90 mL/min   GFR calc Af Amer >90  >90 mL/min   Anion gap 12  5 - 15  HCG, QUANTITATIVE, PREGNANCY      Result Value Ref Range   hCG, Beta Chain, Quant, S 10196 (*) <5 mIU/mL  URINE MICROSCOPIC-ADD ON  Result Value Ref Range   Squamous Epithelial / LPF RARE  RARE   WBC, UA 3-6  <3 WBC/hpf   RBC / HPF TOO NUMEROUS TO COUNT  <3 RBC/hpf   Bacteria, UA RARE  RARE  POC URINE PREG, ED      Result Value Ref Range   Preg Test, Ur POSITIVE (*) NEGATIVE  ABO/RH      Result Value Ref Range   ABO/RH(D) O POS     [place] Ob Comp Less 14 Wks  07/03/2014   CLINICAL DATA:  Pregnant. Beta HCG equals 10,000. Persistent vaginal bleeding. Estimated gestational age by last menstrual period equals 11 weeks 5 days.  EXAM: OBSTETRIC <14 WK ULTRASOUND  TECHNIQUE: Transabdominal ultrasound was performed for evaluation of the gestation as well as the maternal uterus and adnexal regions.  COMPARISON:  None.  FINDINGS: Intrauterine gestational sac: Not identified  Yolk sac:  Not identified  Embryo:  Not identified  Maternal uterus/adnexae: The endometrium is uniformly thickened to 23 mm. There is blood flow within the thickened endometrium. No discrete nodularity or fluid present. The ovaries are normal. No free fluid.  IMPRESSION: 1. No gestational sac identified. Thickened endometrium with sonographer reporting passages of blood  clots. Findings are most consistent with spontaneous abortion in progress. Cannot exclude trophoblastic disease. Recommend follow-up ultrasound and beta HCG. 2. Normal ovaries. 3. No free fluid.   Electronically Signed   By: Genevive BiStewart  Edmunds M.D.   On: 07/03/2014 07:58   [place] Ob Transvaginal  07/03/2014   CLINICAL DATA:  Pregnant. Beta HCG equals 10,000. Persistent vaginal bleeding. Estimated gestational age by last menstrual period equals 11 weeks 5 days.  EXAM: OBSTETRIC <14 WK ULTRASOUND  TECHNIQUE: Transabdominal ultrasound was performed for evaluation of the gestation as well as the maternal uterus and adnexal regions.  COMPARISON:  None.  FINDINGS: Intrauterine gestational sac: Not identified  Yolk sac:  Not identified  Embryo:  Not identified  Maternal uterus/adnexae: The endometrium is uniformly thickened to 23 mm. There is blood flow within the thickened endometrium. No discrete nodularity or fluid present. The ovaries are normal. No free fluid.  IMPRESSION: 1. No gestational sac identified. Thickened endometrium with sonographer reporting passages of blood clots. Findings are most consistent with spontaneous abortion in progress. Cannot exclude trophoblastic disease. Recommend follow-up ultrasound and beta HCG. 2. Normal ovaries. 3. No free fluid.   Electronically Signed   By: Genevive BiStewart  Edmunds M.D.   On: 07/03/2014 07:58    8:11 AM No ectopic.  Suspect miscarriage, but cannot rule out trophoblastic disease.  Recommend repeat US and HCG on Monday.  Patient has agreed to follow-up with OBGYN on Monday.  Will DC to home.  Roxy Horsemanobert Akim Watkinson, PA-C 07/03/14 [phone]

## 2014-07-03 NOTE — ED Notes (Signed)
MD at bedside. 

## 2014-07-03 NOTE — ED Notes (Signed)
Pt attempted to esign and computer malfunctioned. Pt verbalized understanding of d/c instructions and follow-up

## 2014-07-03 NOTE — ED Notes (Signed)
Pt taken to US

## 2014-07-03 NOTE — Discharge Instructions (Signed)
Miscarriage A miscarriage is the sudden loss of an unborn baby (fetus) before the 20th week of pregnancy. Most miscarriages happen in the first 3 months of pregnancy. Sometimes, it happens before a woman even knows she is pregnant. A miscarriage is also called a "spontaneous miscarriage" or "early pregnancy loss." Having a miscarriage can be an emotional experience. Talk with your caregiver about any questions you may have about miscarrying, the grieving process, and your future pregnancy plans. CAUSES   Problems with the fetal chromosomes that make it impossible for the baby to develop normally. Problems with the baby's genes or chromosomes are most often the result of errors that occur, by chance, as the embryo divides and grows. The problems are not inherited from the parents.  Infection of the cervix or uterus.   Hormone problems.   Problems with the cervix, such as having an incompetent cervix. This is when the tissue in the cervix is not strong enough to hold the pregnancy.   Problems with the uterus, such as an abnormally shaped uterus, uterine fibroids, or congenital abnormalities.   Certain medical conditions.   Smoking, drinking alcohol, or taking illegal drugs.   Trauma.  Often, the cause of a miscarriage is unknown.  SYMPTOMS   Vaginal bleeding or spotting, with or without cramps or pain.  Pain or cramping in the abdomen or lower back.  Passing fluid, tissue, or blood clots from the vagina. DIAGNOSIS  Your caregiver will perform a physical exam. You may also have an ultrasound to confirm the miscarriage. Blood or urine tests may also be ordered. TREATMENT   Sometimes, treatment is not necessary if you naturally pass all the fetal tissue that was in the uterus. If some of the fetus or placenta remains in the body (incomplete miscarriage), tissue left behind may become infected and must be removed. Usually, a dilation and curettage (D and C) procedure is performed.  During a D and C procedure, the cervix is widened (dilated) and any remaining fetal or placental tissue is gently removed from the uterus.  Antibiotic medicines are prescribed if there is an infection. Other medicines may be given to reduce the size of the uterus (contract) if there is a lot of bleeding.  If you have Rh negative blood and your baby was Rh positive, you will need a Rh immunoglobulin shot. This shot will protect any future baby from having Rh blood problems in future pregnancies. HOME CARE INSTRUCTIONS   Your caregiver may order bed rest or may allow you to continue light activity. Resume activity as directed by your caregiver.  Have someone help with home and family responsibilities during this time.   Keep track of the number of sanitary pads you use each day and how soaked (saturated) they are. Write down this information.   Do not use tampons. Do not douche or have sexual intercourse until approved by your caregiver.   Only take over-the-counter or prescription medicines for pain or discomfort as directed by your caregiver.   Do not take aspirin. Aspirin can cause bleeding.   Keep all follow-up appointments with your caregiver.   If you or your partner have problems with grieving, talk to your caregiver or seek counseling to help cope with the pregnancy loss. Allow enough time to grieve before trying to get pregnant again.  SEEK IMMEDIATE MEDICAL CARE IF:   You have severe cramps or pain in your back or abdomen.  You have a fever.  You pass large blood clots (walnut-sized   or larger) ortissue from your vagina. Save any tissue for your caregiver to inspect.   Your bleeding increases.   You have a thick, bad-smelling vaginal discharge.  You become lightheaded, weak, or you faint.   You have chills.  MAKE SURE YOU:  Understand these instructions.  Will watch your condition.  Will get help right away if you are not doing well or get  worse. Document Released: 06/05/2001 Document Revised: 04/06/2013 Document Reviewed: 01/29/2012 ExitCare Patient Information 2015 [employer]. This information is not intended to replace advice given to you by your health care provider. Make sure you discuss any questions you have with your health care provider.  

## 2014-07-03 NOTE — ED Notes (Addendum)
Pt states she awoke at 0100 with heavy vaginal bleeding and clotting. Pt states she has been bleeding x 21 days. Pt c/o upper abd pain, cramping in nature.  Pt has not contacted GYN.

## 2014-07-05 ENCOUNTER — Encounter: Payer: Self-pay | Admitting: Family Medicine

## 2014-07-05 ENCOUNTER — Ambulatory Visit (INDEPENDENT_AMBULATORY_CARE_PROVIDER_SITE_OTHER): Payer: Self-pay | Admitting: Family Medicine

## 2014-07-05 ENCOUNTER — Other Ambulatory Visit: Payer: Self-pay

## 2014-07-05 VITALS — BP 125/83 | HR 107 | Temp 98.2°F | Ht 62.0 in | Wt 207.0 lb

## 2014-07-05 DIAGNOSIS — N939 Abnormal uterine and vaginal bleeding, unspecified: Secondary | ICD-10-CM

## 2014-07-05 DIAGNOSIS — O034 Incomplete spontaneous abortion without complication: Secondary | ICD-10-CM | POA: Insufficient documentation

## 2014-07-05 DIAGNOSIS — R102 Pelvic and perineal pain: Secondary | ICD-10-CM

## 2014-07-05 DIAGNOSIS — N949 Unspecified condition associated with female genital organs and menstrual cycle: Secondary | ICD-10-CM

## 2014-07-05 LAB — GC/CHLAMYDIA PROBE AMP
CT Probe RNA: NEGATIVE
GC Probe RNA: NEGATIVE

## 2014-07-05 LAB — HCG, QUANTITATIVE, PREGNANCY: HCG, BETA CHAIN, QUANT, S: 2330.7 m[IU]/mL

## 2014-07-05 NOTE — Patient Instructions (Signed)
Sandra Pittman, it was a pleasure seeing you today. Today we talked about your abdominal cramping and pain. I want you to take ibuprofen 600mg  every 6 hours as needed for pain  Please schedule an appointment to see Dr. Shawnie PonsPratt.  If you have any questions or concerns, please do not hesitate to call the office at [phone].  Sincerely,  Jacquelin Hawkingalph Perez Dirico, MD

## 2014-07-05 NOTE — Assessment & Plan Note (Signed)
Most likely secondary to current spontaneous abortion. Will prescribe ibuprofen 600mg  via OTC. Patient's beta-HCG trending down. Will order repeat for later this week and have her follow-up with Dr. Shawnie PonsPratt. Return precautions given to patient.

## 2014-07-05 NOTE — Progress Notes (Signed)
   Subjective:    Patient ID: Sandra Pittman, female    DOB: 06/09/1981, 33 y.o.   MRN: [id number]  HPI  Patient presents for same day appointment of pelvic pain. Patient recently diagnosed with spontaneous abortion. Patient is currently still bleeding and has passed a few painful, large clots. [facility], and was told to follow-up at our clinic today. Her pain is located throughout her pelvis area and is crampy/sharp at times. No fevers, chills, nausea or vomiting. No vaginal discharge. No dysuria.  Social history: smoking history reviewed  Review of Systems Per HPI    Objective:   Physical Exam  Constitutional: She appears well-developed and well-nourished.  Abdominal: Soft. There is tenderness in the right lower quadrant, suprapubic area and left lower quadrant. There is no rigidity, no rebound, no guarding and no CVA tenderness.          Assessment & Plan:

## 2014-07-07 ENCOUNTER — Encounter: Payer: Self-pay | Admitting: *Deleted

## 2014-07-07 ENCOUNTER — Encounter: Payer: Self-pay | Admitting: Family Medicine

## 2014-07-07 ENCOUNTER — Ambulatory Visit (INDEPENDENT_AMBULATORY_CARE_PROVIDER_SITE_OTHER): Payer: Self-pay | Admitting: Family Medicine

## 2014-07-07 VITALS — BP 122/74 | HR 84 | Ht 62.0 in | Wt 209.0 lb

## 2014-07-07 DIAGNOSIS — O034 Incomplete spontaneous abortion without complication: Secondary | ICD-10-CM

## 2014-07-07 DIAGNOSIS — R102 Pelvic and perineal pain: Secondary | ICD-10-CM

## 2014-07-07 DIAGNOSIS — N949 Unspecified condition associated with female genital organs and menstrual cycle: Secondary | ICD-10-CM

## 2014-07-07 MED ORDER — MISOPROSTOL 200 MCG PO TABS: 1000.0000 ug | ORAL_TABLET | Freq: Four times a day (QID) | ORAL | Status: DC

## 2014-07-07 MED ORDER — MISOPROSTOL 200 MCG PO TABS: 1000.0000 ug | ORAL_TABLET | Freq: Once | ORAL | Status: DC

## 2014-07-07 NOTE — Patient Instructions (Signed)
Incomplete Miscarriage °A miscarriage is the sudden loss of an unborn baby (fetus) before the 20th week of pregnancy. In an incomplete miscarriage, parts of the fetus or placenta (afterbirth) remain in the body.  °Having a miscarriage can be an emotional experience. Talk with your health care provider about any questions you may have about miscarrying, the grieving process, and your future pregnancy plans. °CAUSES  °· Problems with the fetal chromosomes that make it impossible for the baby to develop normally. Problems with the baby's genes or chromosomes are most often the result of errors that occur by chance as the embryo divides and grows. The problems are not inherited from the parents. °· Infection of the cervix or uterus. °· Hormone problems. °· Problems with the cervix, such as having an incompetent cervix. This is when the tissue in the cervix is not strong enough to hold the pregnancy. °· Problems with the uterus, such as an abnormally shaped uterus, uterine fibroids, or congenital abnormalities. °· Certain medical conditions. °· Smoking, drinking alcohol, or taking illegal drugs. °· Trauma. °SYMPTOMS  °· Vaginal bleeding or spotting, with or without cramps or pain. °· Pain or cramping in the abdomen or lower back. °· Passing fluid, tissue, or blood clots from the vagina. °DIAGNOSIS  °Your health care provider will perform a physical exam. You may also have an ultrasound to confirm the miscarriage. Blood or urine tests may also be ordered. °TREATMENT  °· Usually, a dilation and curettage (D&C) procedure is performed. During a D&C procedure, the cervix is widened (dilated) and any remaining fetal or placental tissue is gently removed from the uterus. °· Antibiotic medicines are prescribed if there is an infection. Other medicines may be given to reduce the size of the uterus (contract) if there is a lot of bleeding. °· If you have Rh negative blood and your baby was Rh positive, you will need an Rho(D)  immune globulin shot. This shot will protect any future baby from having Rh blood problems in future pregnancies. °· You may be confined to bed rest. This means you should stay in bed and only get up to use the bathroom. °HOME CARE INSTRUCTIONS  °· Rest as directed by your health care provider. °· Restrict activity as directed by your health care provider. You may be allowed to continue light activity if curettage was not done but you require further treatment. °· Keep track of the number of pads you use each day. Keep track of how soaked (saturated) they are. Record this information. °· Do not  use tampons. °· Do not douche or have sexual intercourse until approved by your health care provider. °· Keep all follow-up appointments for re-evaluation and continuing management. °· Only take over-the-counter or prescription medicines for pain, fever, or discomfort as directed by your health care provider. °· Take antibiotic medicine as directed by your health care provider. Make sure you finish it even if you start to feel better. °SEEK IMMEDIATE MEDICAL CARE IF:  °· You experience severe cramps in your stomach, back, or abdomen. °· You have an unexplained temperature (make sure to record these temperatures). °· You pass large clots or tissue (save these for your health care provider to inspect). °· Your bleeding increases. °· You become light-headed, weak, or have fainting episodes. °MAKE SURE YOU:  °· Understand these instructions. °· Will watch your condition. °· Will get help right away if you are not doing well or get worse. °Document Released: 12/10/2005 Document Revised: 09/30/2013 Document Reviewed: 07/09/2013 °  ExitCare® Patient Information ©2015 [employer]. This information is not intended to replace advice given to you by your health care provider. Make sure you discuss any questions you have with your health care provider. ° °

## 2014-07-07 NOTE — Assessment & Plan Note (Signed)
BHCG today. Cytotec today.  Should be better by next week.

## 2014-07-07 NOTE — Addendum Note (Signed)
Addended by: Reva BoresPRATT, Kjell Brannen S on: 07/07/2014 03:28 PM   Modules accepted: Orders

## 2014-07-07 NOTE — Progress Notes (Signed)
    Subjective:    Patient ID: Sandra Pittman is a 33 y.o. female presenting with Miscarriage  on 07/07/2014  HPI:  Continues to have bleeding and cramping. She is using Ibuprofen.  Review of Systems  Constitutional: Negative for fever.  Respiratory: Negative for shortness of breath.   Gastrointestinal: Negative for abdominal pain.      Objective:    BP 122/74  Pulse 84  Ht 5\' 2"  (1.575 m)  Wt 209 lb (94.802 kg)  BMI 38.22 kg/m2  LMP 06/12/2014 Physical Exam  Vitals reviewed. Constitutional: She appears well-developed and well-nourished.  HENT:  Head: Normocephalic and atraumatic.  Neck: Neck supple.  Cardiovascular: Normal rate.   Pulmonary/Chest: Effort normal.  Abdominal: Soft. There is no tenderness.  Musculoskeletal: Normal range of motion.  Neurological: She is alert.        Assessment & Plan:   Problem List Items Addressed This Visit     Unprioritized   Incomplete miscarriage - Primary     BHCG today. Cytotec today.  Should be better by next week.    Relevant Medications      misoprostol (CYTOTEC) tablet       Return in about 2 weeks (around 07/21/2014) for a follow-up.

## 2014-07-08 LAB — HCG, QUANTITATIVE, PREGNANCY: HCG, BETA CHAIN, QUANT, S: 540.1 m[IU]/mL

## 2014-07-20 ENCOUNTER — Encounter: Payer: Self-pay | Admitting: Family Medicine

## 2014-07-20 ENCOUNTER — Ambulatory Visit (INDEPENDENT_AMBULATORY_CARE_PROVIDER_SITE_OTHER): Payer: Self-pay | Admitting: Family Medicine

## 2014-07-20 VITALS — BP 118/82 | HR 80 | Temp 98.4°F | Ht 62.0 in | Wt 208.0 lb

## 2014-07-20 DIAGNOSIS — I10 Essential (primary) hypertension: Secondary | ICD-10-CM

## 2014-07-20 DIAGNOSIS — F3289 Other specified depressive episodes: Secondary | ICD-10-CM

## 2014-07-20 DIAGNOSIS — Z3009 Encounter for other general counseling and advice on contraception: Secondary | ICD-10-CM

## 2014-07-20 DIAGNOSIS — F17200 Nicotine dependence, unspecified, uncomplicated: Secondary | ICD-10-CM

## 2014-07-20 DIAGNOSIS — F329 Major depressive disorder, single episode, unspecified: Secondary | ICD-10-CM

## 2014-07-20 DIAGNOSIS — Z309 Encounter for contraceptive management, unspecified: Secondary | ICD-10-CM | POA: Insufficient documentation

## 2014-07-20 NOTE — Patient Instructions (Signed)
Depression Depression refers to feeling sad, low, down in the dumps, blue, gloomy, or empty. In general, there are two kinds of depression: 1. Normal sadness or normal grief. This kind of depression is one that we all feel from time to time after upsetting life experiences, such as the loss of a job or the ending of a relationship. This kind of depression is considered normal, is short lived, and resolves within a few days to 2 weeks. Depression experienced after the loss of a loved one (bereavement) often lasts longer than 2 weeks but normally gets better with time. 2. Clinical depression. This kind of depression lasts longer than normal sadness or normal grief or interferes with your ability to function at home, at work, and in school. It also interferes with your personal relationships. It affects almost every aspect of your life. Clinical depression is an illness. Symptoms of depression can also be caused by conditions other than those mentioned above, such as:  Physical illness. Some physical illnesses, including underactive thyroid gland (hypothyroidism), severe anemia, specific types of cancer, diabetes, uncontrolled seizures, heart and lung problems, strokes, and chronic pain are commonly associated with symptoms of depression.  Side effects of some prescription medicine. In some people, certain types of medicine can cause symptoms of depression.  Substance abuse. Abuse of alcohol and illicit drugs can cause symptoms of depression. SYMPTOMS Symptoms of normal sadness and normal grief include the following:  Feeling sad or crying for short periods of time.  Not caring about anything (apathy).  Difficulty sleeping or sleeping too much.  No longer able to enjoy the things you used to enjoy.  Desire to be by oneself all the time (social isolation).  Lack of energy or motivation.  Difficulty concentrating or remembering.  Change in appetite or weight.  Restlessness or  agitation. Symptoms of clinical depression include the same symptoms of normal sadness or normal grief and also the following symptoms:  Feeling sad or crying all the time.  Feelings of guilt or worthlessness.  Feelings of hopelessness or helplessness.  Thoughts of suicide or the desire to harm yourself (suicidal ideation).  Loss of touch with reality (psychotic symptoms). Seeing or hearing things that are not real (hallucinations) or having false beliefs about your life or the people around you (delusions and paranoia). DIAGNOSIS  The diagnosis of clinical depression is usually based on how bad the symptoms are and how long they have lasted. Your health care provider will also ask you questions about your medical history and substance use to find out if physical illness, use of prescription medicine, or substance abuse is causing your depression. Your health care provider may also order blood tests. TREATMENT  Often, normal sadness and normal grief do not require treatment. However, sometimes antidepressant medicine is given for bereavement to ease the depressive symptoms until they resolve. The treatment for clinical depression depends on how bad the symptoms are but often includes antidepressant medicine, counseling with a mental health professional, or both. Your health care provider will help to determine what treatment is best for you. Depression caused by physical illness usually goes away with appropriate medical treatment of the illness. If prescription medicine is causing depression, talk with your health care provider about stopping the medicine, decreasing the dose, or changing to another medicine. Depression caused by the abuse of alcohol or illicit drugs goes away when you stop using these substances. Some adults need professional help in order to stop drinking or using drugs. SEEK IMMEDIATE MEDICAL   CARE IF:  You have thoughts about hurting yourself or others.  You lose touch  with reality (have psychotic symptoms).  You are taking medicine for depression and have a serious side effect. FOR MORE INFORMATION  National Alliance on Mental Illness: www.nami.[employer]  National Institute of Mental Health: http://www.maynard.net/www.nimh.nih.gov Document Released: 12/07/2000 Document Revised: 04/26/2014 Document Reviewed: 03/10/2012 [facility] Patient Information 2015 [place]. This information is not intended to replace advice given to you by your health care provider. Make sure you discuss any questions you have with your health care provider. Contraception Choices Contraception (birth control) is the use of any methods or devices to prevent pregnancy. Below are some methods to help avoid pregnancy. HORMONAL METHODS   Contraceptive implant. This is a thin, plastic tube containing progesterone hormone. It does not contain estrogen hormone. Your health care provider inserts the tube in the inner part of the upper arm. The tube can remain in place for up to 3 years. After 3 years, the implant must be removed. The implant prevents the ovaries from releasing an egg (ovulation), thickens the cervical mucus to prevent sperm from entering the uterus, and thins the lining of the inside of the uterus.  Progesterone-only injections. These injections are given every 3 months by your health care provider to prevent pregnancy. This synthetic progesterone hormone stops the ovaries from releasing eggs. It also thickens cervical mucus and changes the uterine lining. This makes it harder for sperm to survive in the uterus.  Birth control pills. These pills contain estrogen and progesterone hormone. They work by preventing the ovaries from releasing eggs (ovulation). They also cause the cervical mucus to thicken, preventing the sperm from entering the uterus. Birth control pills are prescribed by a health care provider.Birth control pills can also be used to treat heavy periods.  Minipill. This type of birth  control pill contains only the progesterone hormone. They are taken every day of each month and must be prescribed by your health care provider.  Birth control patch. The patch contains hormones similar to those in birth control pills. It must be changed once a week and is prescribed by a health care provider.  Vaginal ring. The ring contains hormones similar to those in birth control pills. It is left in the vagina for 3 weeks, removed for 1 week, and then a new one is put back in place. The patient must be comfortable inserting and removing the ring from the vagina.A health care provider's prescription is necessary.  Emergency contraception. Emergency contraceptives prevent pregnancy after unprotected sexual intercourse. This pill can be taken right after sex or up to 5 days after unprotected sex. It is most effective the sooner you take the pills after having sexual intercourse. Most emergency contraceptive pills are available without a prescription. Check with your pharmacist. Do not use emergency contraception as your only form of birth control. BARRIER METHODS   Female condom. This is a thin sheath (latex or rubber) that is worn over the penis during sexual intercourse. It can be used with spermicide to increase effectiveness.  Female condom. This is a soft, loose-fitting sheath that is put into the vagina before sexual intercourse.  Diaphragm. This is a soft, latex, dome-shaped barrier that must be fitted by a health care provider. It is inserted into the vagina, along with a spermicidal jelly. It is inserted before intercourse. The diaphragm should be left in the vagina for 6 to 8 hours after intercourse.  Cervical cap. This is a round, soft,  latex or plastic cup that fits over the cervix and must be fitted by a health care provider. The cap can be left in place for up to 48 hours after intercourse.  Sponge. This is a soft, circular piece of polyurethane foam. The sponge has spermicide in it.  It is inserted into the vagina after wetting it and before sexual intercourse.  Spermicides. These are chemicals that kill or block sperm from entering the cervix and uterus. They come in the form of creams, jellies, suppositories, foam, or tablets. They do not require a prescription. They are inserted into the vagina with an applicator before having sexual intercourse. The process must be repeated every time you have sexual intercourse. INTRAUTERINE CONTRACEPTION  Intrauterine device (IUD). This is a T-shaped device that is put in a woman's uterus during a menstrual period to prevent pregnancy. There are 2 types:  Copper IUD. This type of IUD is wrapped in copper wire and is placed inside the uterus. Copper makes the uterus and fallopian tubes produce a fluid that kills sperm. It can stay in place for 10 years.  Hormone IUD. This type of IUD contains the hormone progestin (synthetic progesterone). The hormone thickens the cervical mucus and prevents sperm from entering the uterus, and it also thins the uterine lining to prevent implantation of a fertilized egg. The hormone can weaken or kill the sperm that get into the uterus. It can stay in place for 3-5 years, depending on which type of IUD is used. PERMANENT METHODS OF CONTRACEPTION  Female tubal ligation. This is when the woman's fallopian tubes are surgically sealed, tied, or blocked to prevent the egg from traveling to the uterus.  Hysteroscopic sterilization. This involves placing a small coil or insert into each fallopian tube. Your doctor uses a technique called hysteroscopy to do the procedure. The device causes scar tissue to form. This results in permanent blockage of the fallopian tubes, so the sperm cannot fertilize the egg. It takes about 3 months after the procedure for the tubes to become blocked. You must use another form of birth control for these 3 months.  Female sterilization. This is when the female has the tubes that carry sperm  tied off (vasectomy).This blocks sperm from entering the vagina during sexual intercourse. After the procedure, the man can still ejaculate fluid (semen). NATURAL PLANNING METHODS  Natural family planning. This is not having sexual intercourse or using a barrier method (condom, diaphragm, cervical cap) on days the woman could become pregnant.  Calendar method. This is keeping track of the length of each menstrual cycle and identifying when you are fertile.  Ovulation method. This is avoiding sexual intercourse during ovulation.  Symptothermal method. This is avoiding sexual intercourse during ovulation, using a thermometer and ovulation symptoms.  Post-ovulation method. This is timing sexual intercourse after you have ovulated. Regardless of which type or method of contraception you choose, it is important that you use condoms to protect against the transmission of sexually transmitted infections (STIs). Talk with your health care provider about which form of contraception is most appropriate for you. Document Released: 12/10/2005 Document Revised: 12/15/2013 Document Reviewed: 06/04/2013 [facility] Patient Information 2015 [place], [place]. This information is not intended to replace advice given to you by your health care provider. Make sure you discuss any questions you have with your health care provider.

## 2014-07-20 NOTE — Assessment & Plan Note (Signed)
Advised of choices.  Will use condoms for now.

## 2014-07-20 NOTE — Assessment & Plan Note (Signed)
Will stop when ready for pregnancy

## 2014-07-20 NOTE — Assessment & Plan Note (Signed)
Effexor as needed

## 2014-07-20 NOTE — Assessment & Plan Note (Signed)
BP ok on current regimen

## 2014-07-20 NOTE — Progress Notes (Signed)
    Subjective:    Patient ID: Sandra Pittman is a 33 y.o. female presenting with Follow-up  on 07/20/2014  HPI: Here for f/u miscarriage.  She has stopped bleeding and cramping.  She is uninterested in birth control. Has tried and failed Depo, numerous OC's, Nuvaring and Mirena in the past.  She is not taking nor does she feel she needs her Effexor.  Review of Systems  Constitutional: Negative for fever and chills.  Respiratory: Negative for shortness of breath.   Cardiovascular: Negative for leg swelling.  Gastrointestinal: Negative for nausea, vomiting and abdominal pain.  Musculoskeletal: Negative for neck pain.  Skin: Negative for rash.      Objective:    BP 118/82  Pulse 80  Temp(Src) 98.4 F (36.9 C) (Oral)  Ht 5\' 2"  (1.575 m)  Wt 208 lb (94.348 kg)  BMI 38.03 kg/m2  LMP 06/12/2014 Physical Exam  Vitals reviewed. Constitutional: She appears well-developed and well-nourished. No distress.  HENT:  Head: Normocephalic and atraumatic.  Eyes: No scleral icterus.  Neck: Neck supple.  Cardiovascular: Normal rate.   Pulmonary/Chest: Effort normal.  Abdominal: Soft.  Musculoskeletal: She exhibits no edema.  Neurological: She is alert.  Skin: Skin is warm. No rash noted.  Psychiatric: She has a normal mood and affect.        Assessment & Plan:  HYPERTENSION BP ok on current regimen  DEPRESSION Effexor as needed  TOBACCO ABUSE Will stop when ready for pregnancy  Contraceptive Counseling Advised of choices.  Will use condoms for now.      Return in about 3 months (around 10/20/2014).

## 2014-09-01 ENCOUNTER — Telehealth: Payer: Self-pay | Admitting: Family Medicine

## 2014-09-01 MED ORDER — NORGESTIMATE-ETH ESTRADIOL 0.25-35 MG-MCG PO TABS: 1.0000 | ORAL_TABLET | Freq: Every day | ORAL | Status: DC

## 2014-09-01 NOTE — Telephone Encounter (Signed)
Pt called and has decided to start the [facility] pills. She would like something called in to the [facility] on [address] and try to make this a cheaper type since she doesn't have insurance. jw

## 2014-09-01 NOTE — Telephone Encounter (Signed)
Start Sunday after next menses. Must come by for BP check after 1 month. May be a nurse visit.

## 2014-09-02 NOTE — Telephone Encounter (Signed)
Pt informed of message.  Voiced understanding on instructions. Schylar Allard,CMA

## 2014-10-08 ENCOUNTER — Other Ambulatory Visit: Payer: Self-pay

## 2014-10-28 ENCOUNTER — Telehealth: Payer: Self-pay | Admitting: Family Medicine

## 2014-10-28 NOTE — Telephone Encounter (Signed)
Started her birth control 3 weeks ago but has bleed the whole time. Please advise

## 2014-11-01 NOTE — Telephone Encounter (Signed)
Pt is aware of message from MD.  Will call for an appt if bleeding continues. Jazmin Hartsell,CMA

## 2014-11-15 ENCOUNTER — Encounter (HOSPITAL_COMMUNITY): Payer: Self-pay | Admitting: Emergency Medicine

## 2014-11-15 ENCOUNTER — Emergency Department (HOSPITAL_COMMUNITY)
Admission: EM | Admit: 2014-11-15 | Discharge: 2014-11-15 | Disposition: A | Discharge status: Home / Self Care | Payer: Self-pay | Attending: Emergency Medicine | Admitting: Emergency Medicine

## 2014-11-15 DIAGNOSIS — L732 Hidradenitis suppurativa: Secondary | ICD-10-CM | POA: Insufficient documentation

## 2014-11-15 DIAGNOSIS — L02214 Cutaneous abscess of groin: Secondary | ICD-10-CM | POA: Insufficient documentation

## 2014-11-15 DIAGNOSIS — L0291 Cutaneous abscess, unspecified: Secondary | ICD-10-CM

## 2014-11-15 DIAGNOSIS — I10 Essential (primary) hypertension: Secondary | ICD-10-CM | POA: Insufficient documentation

## 2014-11-15 DIAGNOSIS — F329 Major depressive disorder, single episode, unspecified: Secondary | ICD-10-CM | POA: Insufficient documentation

## 2014-11-15 DIAGNOSIS — Z79899 Other long term (current) drug therapy: Secondary | ICD-10-CM | POA: Insufficient documentation

## 2014-11-15 DIAGNOSIS — N764 Abscess of vulva: Secondary | ICD-10-CM | POA: Insufficient documentation

## 2014-11-15 DIAGNOSIS — Z720 Tobacco use: Secondary | ICD-10-CM | POA: Insufficient documentation

## 2014-11-15 DIAGNOSIS — L02213 Cutaneous abscess of chest wall: Secondary | ICD-10-CM | POA: Insufficient documentation

## 2014-11-15 MED ORDER — LIDOCAINE HCL (PF) 1 % IJ SOLN
5.0000 mL | Freq: Once | INTRAMUSCULAR | Status: AC
  Administered 2014-11-15: 5 mL via INTRADERMAL
  Filled 2014-11-15: qty 5

## 2014-11-15 MED ORDER — HYDROCODONE-ACETAMINOPHEN 5-325 MG PO TABS: 1.0000 | ORAL_TABLET | ORAL | Status: DC | PRN

## 2014-11-15 MED ORDER — CLINDAMYCIN HCL 150 MG PO CAPS: 300.0000 mg | ORAL_CAPSULE | Freq: Three times a day (TID) | ORAL | Status: DC

## 2014-11-15 NOTE — ED Provider Notes (Signed)
CSN: [id number]     Arrival date & time 11/15/14  1058 History   First MD Initiated Contact with Patient 11/15/14 1217     Chief Complaint  Patient presents with  . Abscess    Right breast, vagina   (Consider location/radiation/quality/duration/timing/severity/associated sxs/prior Treatment) HPI Sandra Pittman is a 33 yo female presenting with report of 2 "sore places".  She reports she tends to get abscesses but usually they are small and drain on their own.  She usually gets these small abscesses under her axilla and in her groin. She has one on her left external labia and one under her right breast.  They both began hurting appr 1 week ago.  She describes the pain as sharp with palpation but otherwise a dull ache.  She denies fever, chills, nausea, vomiting, or red streaking from either abscess.    Past Medical History  Diagnosis Date  . Depression   . Hypertension    Past Surgical History  Procedure Laterality Date  . Diagnostic laparoscopy    . Wisdom tooth extraction     Family History  Problem Relation Age of Onset  . Diabetes Mother    History  Substance Use Topics  . Smoking status: Current Every Day Smoker -- 0.50 packs/day for .3 years    Types: Cigarettes, Cigars  . Smokeless tobacco: Never Used     Comment: 8 cigs per day  . Alcohol Use: Yes     Comment: occ   OB History    No data available     Review of Systems  Constitutional: Negative for fever and chills.  HENT: Negative for sore throat.   Eyes: Negative for visual disturbance.  Respiratory: Negative for cough and shortness of breath.   Cardiovascular: Negative for chest pain and leg swelling.  Gastrointestinal: Negative for nausea, vomiting and diarrhea.  Genitourinary: Negative for dysuria.  Musculoskeletal: Negative for myalgias.  Skin: Positive for color change and rash.  Neurological: Negative for weakness, numbness and headaches.   Allergies  Review of patient's allergies indicates no known  allergies.  Home Medications   Prior to Admission medications   Medication Sig Start Date End Date Taking? Authorizing Provider  acetaminophen (TYLENOL) 500 MG tablet Take 1,000 mg by mouth every 6 (six) hours as needed for moderate pain or headache.   Yes Historical Provider, MD  Amphetamine Sulfate (EVEKEO) 5 MG TABS Take 1 tablet by mouth daily.   Yes Historical Provider, MD  ibuprofen (ADVIL,MOTRIN) 200 MG tablet Take 600 mg by mouth every 6 (six) hours as needed for moderate pain.   Yes Historical Provider, MD  norgestimate-ethinyl estradiol (ORTHO-CYCLEN,SPRINTEC,PREVIFEM) 0.25-35 MG-MCG tablet Take 1 tablet by mouth daily. 09/01/14  Yes Reva Boresanya S Pratt, MD  hydrochlorothiazide (HYDRODIURIL) 25 MG tablet Take 1 tablet (25 mg total) by mouth daily. Patient not taking: Reported on 11/15/2014 08/27/13   Reva Boresanya S Pratt, MD  venlafaxine [facility](EFFEXOR) 75 MG tablet Take 1 tablet (75 mg total) by mouth 2 (two) times daily. 03/15/14   Reva Boresanya S Pratt, MD   BP 160/77 mmHg  Pulse 92  Temp(Src) 98.4 F (36.9 C) (Oral)  Resp 16  SpO2 100% Physical Exam  Constitutional: She appears well-developed and well-nourished. No distress.  HENT:  Head: Normocephalic and atraumatic.  Mouth/Throat: Oropharynx is clear and moist. No oropharyngeal exudate.  Eyes: Conjunctivae are normal.  Neck: Neck supple. No thyromegaly present.  Cardiovascular: Normal rate, regular rhythm and intact distal pulses.   Pulmonary/Chest: Effort normal and breath  sounds normal. No respiratory distress. She has no wheezes. She has no rales. She exhibits no tenderness.  Abdominal: Soft. There is no tenderness.  Musculoskeletal: She exhibits no tenderness.  Lymphadenopathy:    She has no cervical adenopathy.  Neurological: She is alert.  Skin: Skin is warm and dry. Rash noted. She is not diaphoretic.  1. 3 cm diameter of induration and tenderness with central fluctuation on lateral border of external labia, does not extend to medial  thigh  2. 2 cm diameter of induration with small area of central fluctuance to right chest wall under right breast  Psychiatric: She has a normal mood and affect.  Nursing note and vitals reviewed.   ED Course  Procedures (including critical care time)  EMERGENCY DEPARTMENT US SOFT TISSUE INTERPRETATION "Study: Limited Ultrasound of the noted body part in comments below"  INDICATIONS: Pain Multiple views of the body part are obtained with a multi-frequency linear probe  PERFORMED BY:  Myself   IMAGES ARCHIVED?: Yes  SIDE:Left and Right   BODY PART:Other soft tisse (comment in note) Left labia  BODY PART: Right Chest Wall  FINDINGS: Abcess present  LIMITATIONS: N/A  INTERPRETATION:  Abcess present     INCISION AND DRAINAGE #1 Performed by: Harle Battiest Consent: Verbal consent obtained. Risks and benefits: risks, benefits and alternatives were discussed Type: abscess  Body area: left lateral borer of external labia  Anesthesia: local infiltration  Incision was made with a scalpel.  Local anesthetic: lidocaine 1% without epinephrine  Anesthetic total: 2 ml  Complexity: complex Blunt dissection to break up loculations  Drainage: purulent  Drainage amount: large  Packing material: none  Patient tolerance: Patient tolerated the procedure well with no immediate complications.   INCISION AND DRAINAGE #2 Performed by: Harle Battiest Consent: Verbal consent obtained. Risks and benefits: risks, benefits and alternatives were discussed Type: abscess  Body area: right chest wall  Anesthesia: local infiltration  Incision was made with a scalpel.  Local anesthetic: lidocaine 1 %  withoutepinephrine  Anesthetic total: 1 ml  Complexity: complex Blunt dissection to break up loculations  Drainage: purulent  Drainage amount: moderate  Packing material: none  Patient tolerance: Patient tolerated the procedure well with no immediate  complications.   Labs Review Labs Reviewed - No data to display  Imaging Review No results found.   EKG Interpretation None      MDM   Final diagnoses:  Abscess  Hidradenitis   33 yo female with multiple small draining abscesses on thighs and groin.  One large non-draining abscess on left labia and one under right breast. [place] used to identify areas of fluid in both thus both amenable to I&D. Pt tolerated well and feels better after the procedure. Discussed the importance of warm soaks and flushing at home and wound recheck in 2 days. Encouraged home warm soaks and flushing.  Pt has indication of antibiotics because of appearance of hidradenitis.  Pt aware of plan and ina greement.  Return precautions provided.   Filed Vitals:   11/15/14 1104 11/15/14 1404  BP: 160/77 145/101  Pulse: 92 82  Temp: 98.4 F (36.9 C) 98.7 F (37.1 C)  TempSrc: Oral Oral  Resp: 16 16  SpO2: 100% 100%   Meds given in ED:  Medications  lidocaine (PF) (XYLOCAINE) 1 % injection 5 mL (5 mLs Intradermal Given 11/15/14 1503)    Discharge Medication List as of 11/15/2014  3:04 PM    START taking these medications   Details  clindamycin (CLEOCIN) 150 MG capsule Take 2 capsules (300 mg total) by mouth 3 (three) times daily. May dispense as 150mg  capsules, Starting 11/15/2014, Until Discontinued, Print          Harle BattiestElizabeth Uzziel Russey, NP 11/16/14 2252  Hilario Quarryanielle S Ray, MD 11/18/14 [phone]

## 2014-11-15 NOTE — Discharge Instructions (Signed)
Please follow the directions provided.  Be sure to follow-up with Dr. Shawnie PonsPratt in 2 days for a wound check.  Please use warm soaks daily to help the wounds continue to drain.  Change the dressings as often as needed to keep them dry.  Do not use any ointment on the open wounds, they need to stay open to drain.  Don't hesitate to return for any new, worsening or concerning symptoms.    SEEK MEDICAL CARE IF:  You have increased pain, swelling, redness, fluid drainage, or bleeding.  You have muscle aches, chills, or a general ill feeling.  You have a fever.

## 2014-11-17 ENCOUNTER — Encounter: Payer: Self-pay | Admitting: Family Medicine

## 2014-11-17 ENCOUNTER — Ambulatory Visit (INDEPENDENT_AMBULATORY_CARE_PROVIDER_SITE_OTHER): Payer: Self-pay | Admitting: Family Medicine

## 2014-11-17 DIAGNOSIS — L732 Hidradenitis suppurativa: Secondary | ICD-10-CM

## 2014-11-17 MED ORDER — CLINDAMYCIN PHOSPHATE 1 % EX GEL: Freq: Two times a day (BID) | CUTANEOUS | Status: DC

## 2014-11-17 MED ORDER — RESORCINOL-SULFUR 2-8 % EX CREA: 1.0000 "application " | TOPICAL_CREAM | CUTANEOUS | Status: DC | PRN

## 2014-11-17 NOTE — Assessment & Plan Note (Signed)
Recently drained lesion on labia and below breast, improving - continue oral clinda (10 day course given by EDP) - start topical clinda for suppression/prevention - script given for resorcinol for acute lesion but unclear whether this will be affordable for patient, this is less important

## 2014-11-17 NOTE — Patient Instructions (Signed)
For your hydradenitis, I am prescribing a clindamycin gel which you should use daily to prevent more abscesses. I am also prescribing resorcinol which you should use when you notice a bad spot to help keep it from getting worse.

## 2014-11-17 NOTE — Progress Notes (Signed)
   Subjective:    Patient ID: Sandra Pittman, female    DOB: [birth year], [age] y.o.   MRN: [id number]  HPI Presents for f/u of ED visit on Monday. Pt had abscesses drained on labia and below breast. Both are still draining. She reports still having mild discomfort but nothing like the pain from before they were incised. She is changing the dressings daily and taking oral clinda (10 days total rxed by EDP). Denies fever, chills.   Review of Systems See HPI    Objective:   Physical Exam  Constitutional: She is oriented to person, place, and time. She appears well-developed and well-nourished. No distress.  HENT:  Head: Normocephalic and atraumatic.  Eyes: Conjunctivae are normal. Right eye exhibits no discharge. Left eye exhibits no discharge. No scleral icterus.  Cardiovascular: Normal rate.   Pulmonary/Chest: Effort normal.  Abdominal: She exhibits no distension.  Neurological: She is alert and oriented to person, place, and time.  Skin: Skin is warm and dry. She is not diaphoretic.  1. 2 cm diameter of induration w/o tendernessor fluctuance on lateral border of external labia, central purulence draining, no surrounding erythema  2. 1.5 cm diameter of induration without fluctuance to right chest wall under right breast, draining, no surrounding erythema  Psychiatric: She has a normal mood and affect. Her behavior is normal.  Nursing note and vitals reviewed.         Assessment & Plan:

## 2015-02-15 ENCOUNTER — Encounter (HOSPITAL_COMMUNITY): Payer: Self-pay

## 2015-02-15 ENCOUNTER — Emergency Department (HOSPITAL_COMMUNITY)
Admission: EM | Admit: 2015-02-15 | Discharge: 2015-02-15 | Disposition: A | Discharge status: Home / Self Care | Payer: 59 | Attending: Emergency Medicine | Admitting: Emergency Medicine

## 2015-02-15 DIAGNOSIS — I10 Essential (primary) hypertension: Secondary | ICD-10-CM | POA: Insufficient documentation

## 2015-02-15 DIAGNOSIS — Z793 Long term (current) use of hormonal contraceptives: Secondary | ICD-10-CM | POA: Insufficient documentation

## 2015-02-15 DIAGNOSIS — Z79899 Other long term (current) drug therapy: Secondary | ICD-10-CM | POA: Insufficient documentation

## 2015-02-15 DIAGNOSIS — F329 Major depressive disorder, single episode, unspecified: Secondary | ICD-10-CM | POA: Diagnosis not present

## 2015-02-15 DIAGNOSIS — L0291 Cutaneous abscess, unspecified: Secondary | ICD-10-CM

## 2015-02-15 DIAGNOSIS — Z720 Tobacco use: Secondary | ICD-10-CM | POA: Insufficient documentation

## 2015-02-15 DIAGNOSIS — L02213 Cutaneous abscess of chest wall: Secondary | ICD-10-CM | POA: Diagnosis present

## 2015-02-15 MED ORDER — HYDROCHLOROTHIAZIDE 12.5 MG PO TABS: 25.0000 mg | ORAL_TABLET | Freq: Every day | ORAL | Status: DC

## 2015-02-15 MED ORDER — NAPROXEN 500 MG PO TABS: 500.0000 mg | ORAL_TABLET | Freq: Two times a day (BID) | ORAL | Status: DC

## 2015-02-15 MED ORDER — LIDOCAINE HCL 2 % IJ SOLN
10.0000 mL | Freq: Once | INTRAMUSCULAR | Status: AC
  Administered 2015-02-15: 200 mg via INTRADERMAL
  Filled 2015-02-15: qty 20

## 2015-02-15 MED ORDER — CLINDAMYCIN HCL 300 MG PO CAPS: 300.0000 mg | ORAL_CAPSULE | Freq: Four times a day (QID) | ORAL | Status: DC

## 2015-02-15 MED ORDER — OXYCODONE-ACETAMINOPHEN 5-325 MG PO TABS
2.0000 | ORAL_TABLET | Freq: Once | ORAL | Status: AC
Start: 2015-02-15 — End: 2015-02-15
  Administered 2015-02-15: 2 via ORAL
  Filled 2015-02-15: qty 2

## 2015-02-15 NOTE — ED Notes (Signed)
Pt c/o recurrent abscess under R breast since 10/2014.  Pain score 10/10.  Pt reports that she has been seen previously for same.  Sts she completed all antibiotics as prescribed.

## 2015-02-15 NOTE — Discharge Instructions (Signed)
Abscess °An abscess is an infected area that contains a collection of pus and debris. It can occur in almost any part of the body. An abscess is also known as a furuncle or boil. °CAUSES  °An abscess occurs when tissue gets infected. This can occur from blockage of oil or sweat glands, infection of hair follicles, or a minor injury to the skin. As the body tries to fight the infection, pus collects in the area and creates pressure under the skin. This pressure causes pain. People with weakened immune systems have difficulty fighting infections and get certain abscesses more often.  °SYMPTOMS °Usually an abscess develops on the skin and becomes a painful mass that is red, warm, and tender. If the abscess forms under the skin, you may feel a moveable soft area under the skin. Some abscesses break open (rupture) on their own, but most will continue to get worse without care. The infection can spread deeper into the body and eventually into the bloodstream, causing you to feel ill.  °DIAGNOSIS  °Your caregiver will take your medical history and perform a physical exam. A sample of fluid may also be taken from the abscess to determine what is causing your infection. °TREATMENT  °Your caregiver may prescribe antibiotic medicines to fight the infection. However, taking antibiotics alone usually does not cure an abscess. Your caregiver may need to make a small cut (incision) in the abscess to drain the pus. In some cases, gauze is packed into the abscess to reduce pain and to continue draining the area. °HOME CARE INSTRUCTIONS  °· Only take over-the-counter or prescription medicines for pain, discomfort, or fever as directed by your caregiver. °· If you were prescribed antibiotics, take them as directed. Finish them even if you start to feel better. °· If gauze is used, follow your caregiver's directions for changing the gauze. °· To avoid spreading the infection: °· Keep your draining abscess covered with a  bandage. °· Wash your hands well. °· Do not share personal care items, towels, or whirlpools with others. °· Avoid skin contact with others. °· Keep your skin and clothes clean around the abscess. °· Keep all follow-up appointments as directed by your caregiver. °SEEK MEDICAL CARE IF:  °· You have increased pain, swelling, redness, fluid drainage, or bleeding. °· You have muscle aches, chills, or a general ill feeling. °· You have a fever. °MAKE SURE YOU:  °· Understand these instructions. °· Will watch your condition. °· Will get help right away if you are not doing well or get worse. °Document Released: 09/19/2005 Document Revised: 06/10/2012 Document Reviewed: 02/22/2012 °[employer]® Patient Information ©2015 [employer]. This information is not intended to replace advice given to you by your health care provider. Make sure you discuss any questions you have with your health care provider. ° °Abscess °Care After °An abscess (also called a boil or furuncle) is an infected area that contains a collection of pus. Signs and symptoms of an abscess include pain, tenderness, redness, or hardness, or you may feel a moveable soft area under your skin. An abscess can occur anywhere in the body. The infection may spread to surrounding tissues causing cellulitis. A cut (incision) by the surgeon was made over your abscess and the pus was drained out. Gauze may have been packed into the space to provide a drain that will allow the cavity to heal from the inside outwards. The boil may be painful for 5 to 7 days. Most people with a boil do not have   high fevers. Your abscess, if seen early, may not have localized, and may not have been lanced. If not, another appointment may be required for this if it does not get better on its own or with medications. °HOME CARE INSTRUCTIONS  °· Only take over-the-counter or prescription medicines for pain, discomfort, or fever as directed by your caregiver. °· When you bathe, soak and then  remove gauze or iodoform packs at least daily or as directed by your caregiver. You may then wash the wound gently with mild soapy water. Repack with gauze or do as your caregiver directs. °SEEK IMMEDIATE MEDICAL CARE IF:  °· You develop increased pain, swelling, redness, drainage, or bleeding in the wound site. °· You develop signs of generalized infection including muscle aches, chills, fever, or a general ill feeling. °· An oral temperature above 102° F (38.9° C) develops, not controlled by medication. °See your caregiver for a recheck if you develop any of the symptoms described above. If medications (antibiotics) were prescribed, take them as directed. °Document Released: 06/28/2005 Document Revised: 03/03/2012 Document Reviewed: 02/23/2008 °[employer]® Patient Information ©2015 [employer]. This information is not intended to replace advice given to you by your health care provider. Make sure you discuss any questions you have with your health care provider. ° °

## 2015-02-15 NOTE — ED Provider Notes (Signed)
CSN: [id number]     Arrival date & time 02/15/15  1434 History  This chart was scribed for non-physician practitioner, Joycie Peek, PA-C, working with Mirian Mo, MD by Charline Bills, ED Scribe. This patient was seen in room WTR7/WTR7 and the patient's care was started at 3:21 PM.   Chief Complaint  Patient presents with  . Abscess   The history is provided by the patient. No language interpreter was used.   HPI Comments: Sandra Pittman is a 34 y.o. female, with a h/o HTN and depression, who presents to the Emergency Department complaining of recurrent abscess under R breast for the past 3 months. Pt reports having an abscess lanced under her R breast twice in November 2015. She currently reports 10/10 pain that she reports radiates into her R axilla and back and is exacerbated with bending, reaching and movement. Pt states that she has not been able to wear a bra due to severity of pain. She denies fever, chest pain, SOB, vomiting. Pt has been treating with warm compresses, Clindamycin gel and adult acne cream without relief. Pt was last seen by PCP: Reva Bores, MD in July 2015 following a miscarriage.   Past Medical History  Diagnosis Date  . Depression   . Hypertension    Past Surgical History  Procedure Laterality Date  . Diagnostic laparoscopy    . Wisdom tooth extraction     Family History  Problem Relation Age of Onset  . Diabetes Mother    History  Substance Use Topics  . Smoking status: Current Every Day Smoker -- 0.50 packs/day for .3 years    Types: Cigarettes, Cigars  . Smokeless tobacco: Never Used     Comment: 8 cigs per day  . Alcohol Use: Yes     Comment: occ   OB History    No data available     Review of Systems  Constitutional: Negative for fever.  Respiratory: Negative for shortness of breath.   Cardiovascular: Negative for chest pain.  Gastrointestinal: Negative for vomiting.  Skin:       + Abscess   Allergies  Review of patient's  allergies indicates no known allergies.  Home Medications   Prior to Admission medications   Medication Sig Start Date End Date Taking? Authorizing Provider  acetaminophen (TYLENOL) 500 MG tablet Take 1,000 mg by mouth every 6 (six) hours as needed for moderate pain or headache.    Historical Provider, MD  Amphetamine Sulfate (EVEKEO) 5 MG TABS Take 1 tablet by mouth daily.    Historical Provider, MD  clindamycin (CLEOCIN) 300 MG capsule Take 1 capsule (300 mg total) by mouth 4 (four) times daily. X 7 days 02/15/15   Earle Gell Jacy Brocker, PA-C  clindamycin (CLEOCIN-T) 1 % gel Apply topically 2 (two) times daily. 11/17/14   Abram Sander, MD  hydrochlorothiazide (HYDRODIURIL) 12.5 MG tablet Take 2 tablets (25 mg total) by mouth daily. 02/15/15   Sharlene Motts, PA-C  HYDROcodone-acetaminophen (NORCO/VICODIN) 5-325 MG per tablet Take 1-2 tablets by mouth every 4 (four) hours as needed for moderate pain or severe pain. 11/15/14   Harle Battiest, NP  ibuprofen (ADVIL,MOTRIN) 200 MG tablet Take 600 mg by mouth every 6 (six) hours as needed for moderate pain.    Historical Provider, MD  naproxen (NAPROSYN) 500 MG tablet Take 1 tablet (500 mg total) by mouth 2 (two) times daily. 02/15/15   Sharlene Motts, PA-C  norgestimate-ethinyl estradiol (ORTHO-CYCLEN,SPRINTEC,PREVIFEM) 0.25-35 MG-MCG tablet Take 1 tablet by  mouth daily. 09/01/14   Reva Bores, MD  Resorcinol-Sulfur 2-8 % CREA Apply 1 application topically as needed. 11/17/14   Abram Sander, MD  venlafaxine (EFFEXOR) 75 MG tablet Take 1 tablet (75 mg total) by mouth 2 (two) times daily. 03/15/14   Reva Bores, MD   BP 186/109 mmHg  Pulse 92  Temp(Src) 98.7 F (37.1 C) (Oral)  Resp 16  SpO2 100%  LMP 01/25/2015 (Approximate) Physical Exam  Constitutional: She is oriented to person, place, and time. She appears well-developed and well-nourished. No distress.  HENT:  Head: Normocephalic and atraumatic.  Eyes: Conjunctivae and EOM are  normal.  Neck: Neck supple. No tracheal deviation present.  Cardiovascular: Normal rate, regular rhythm and normal heart sounds.   Pulmonary/Chest: Effort normal and breath sounds normal. No respiratory distress.  Under the R breast there is an approximately 4 cm area of fluctuance and tenderness with some erythema. No active drainage noted.  Musculoskeletal: Normal range of motion.  Neurological: She is alert and oriented to person, place, and time.  Skin: Skin is warm and dry.  Psychiatric: She has a normal mood and affect. Her behavior is normal.  Nursing note and vitals reviewed.  ED Course  Procedures (including critical care time) DIAGNOSTIC STUDIES:  EMERGENCY DEPARTMENT US SOFT TISSUE INTERPRETATION "Study: Limited Ultrasound of the noted body part in comments below"  INDICATIONS: Soft tissue infection Multiple views of the body part are obtained with a multi-frequency linear probe  PERFORMED BY:  Myself  IMAGES ARCHIVED?: Yes  SIDE:Right   BODY PART:Chest Wall  FINDINGS: Abcess present  LIMITATIONS:  Body Habitus  INTERPRETATION:  Abcess present  COMMENT:    INCISION AND DRAINAGE Performed by: Sharlene Motts Consent: Verbal consent obtained. Risks and benefits: risks, benefits and alternatives were discussed Type: abscess  Body area: Right chest  Anesthesia: local infiltration  Incision was made with a scalpel.  Local anesthetic: lidocaine 2 % without epinephrine  Anesthetic total: 4 ml  Complexity: complex Blunt dissection to break up loculations  Drainage: purulent  Drainage amount: Copious   Packing material: 1/4 in iodoform gauze  Patient tolerance: Patient tolerated the procedure well with no immediate complications.    Oxygen Saturation is 100% on RA, normal by my interpretation.    COORDINATION OF CARE: 3:25 PM-Discussed treatment plan which includes [place] and I&D with pt at bedside and pt agreed to plan.   Labs Review Labs  Reviewed - No data to display  Imaging Review No results found.   EKG Interpretation None     Meds given in ED:  Medications  lidocaine (XYLOCAINE) 2 % (with pres) injection 200 mg (200 mg Intradermal Given 02/15/15 1639)  oxyCODONE-acetaminophen (PERCOCET/ROXICET) 5-325 MG per tablet 2 tablet (2 tablets Oral Given 02/15/15 1640)    Discharge Medication List as of 02/15/2015  4:34 PM    START taking these medications   Details  naproxen (NAPROSYN) 500 MG tablet Take 1 tablet (500 mg total) by mouth 2 (two) times daily., Starting 02/15/2015, Until Discontinued, Print       Filed Vitals:   02/15/15 1438 02/15/15 1639 02/15/15 1641  BP: 186/109 146/104   Pulse: 92 79   Temp: 98.7 F (37.1 C)    TempSrc: Oral    Resp: 16  16  SpO2: 100% 99%     MDM  Vitals stable -  -afebrile, patient with elevated blood pressure. States she was prescribed HCTZ by her PCP, but stopped taking it  because it made her pee.  Pt resting comfortably in ED. PE-- Not concerning further acute or emergent pathology.  Abscess confirmed via ultrasound and drained at bedside by myself. Patient tolerated procedure well.  Will DC with oral antibiotics, NSAIDs, HCTZ.  Discussed follow-up with her PCP for definitive care on her blood pressure. I discussed all relevant lab findings and imaging results with pt and they verbalized understanding. Discussed f/u with PCP within 48 hrs and return precautions, pt very amenable to plan.  Final diagnoses:  Abscess      I personally performed the services described in this documentation, which was scribed in my presence. The recorded information has been reviewed and is accurate.    Earle GellBenjamin W Mapletonartner, PA-C 02/15/15 2147  Mirian MoMatthew Gentry, MD 02/17/15 1101

## 2015-03-29 ENCOUNTER — Encounter: Payer: Self-pay | Admitting: Family Medicine

## 2015-03-30 ENCOUNTER — Other Ambulatory Visit: Payer: Self-pay | Admitting: *Deleted

## 2015-03-30 MED ORDER — HYDROCHLOROTHIAZIDE 25 MG PO TABS: 25.0000 mg | ORAL_TABLET | Freq: Every day | ORAL | Status: DC

## 2015-09-12 ENCOUNTER — Other Ambulatory Visit: Payer: Self-pay | Admitting: Family Medicine

## 2015-10-14 ENCOUNTER — Other Ambulatory Visit: Payer: Self-pay | Admitting: Family Medicine

## 2015-10-16 ENCOUNTER — Other Ambulatory Visit: Payer: Self-pay | Admitting: Family Medicine

## 2016-01-18 ENCOUNTER — Ambulatory Visit (INDEPENDENT_AMBULATORY_CARE_PROVIDER_SITE_OTHER): Payer: BLUE CROSS/BLUE SHIELD | Admitting: Family Medicine

## 2016-01-18 ENCOUNTER — Encounter: Payer: Self-pay | Admitting: Family Medicine

## 2016-01-18 ENCOUNTER — Other Ambulatory Visit (HOSPITAL_COMMUNITY)
Admission: RE | Admit: 2016-01-18 | Discharge: 2016-01-18 | Disposition: A | Discharge status: Home / Self Care | Payer: BLUE CROSS/BLUE SHIELD | Source: Ambulatory Visit | Attending: Family Medicine | Admitting: Family Medicine

## 2016-01-18 VITALS — BP 160/106 | HR 83 | Temp 98.5°F | Ht 62.0 in | Wt 193.3 lb

## 2016-01-18 DIAGNOSIS — Z23 Encounter for immunization: Secondary | ICD-10-CM

## 2016-01-18 DIAGNOSIS — Z01411 Encounter for gynecological examination (general) (routine) with abnormal findings: Secondary | ICD-10-CM | POA: Diagnosis present

## 2016-01-18 DIAGNOSIS — I10 Essential (primary) hypertension: Secondary | ICD-10-CM

## 2016-01-18 DIAGNOSIS — Z113 Encounter for screening for infections with a predominantly sexual mode of transmission: Secondary | ICD-10-CM | POA: Insufficient documentation

## 2016-01-18 DIAGNOSIS — G4700 Insomnia, unspecified: Secondary | ICD-10-CM

## 2016-01-18 DIAGNOSIS — Z0000 Encounter for general adult medical examination without abnormal findings: Secondary | ICD-10-CM

## 2016-01-18 DIAGNOSIS — F332 Major depressive disorder, recurrent severe without psychotic features: Secondary | ICD-10-CM

## 2016-01-18 DIAGNOSIS — Z1151 Encounter for screening for human papillomavirus (HPV): Secondary | ICD-10-CM | POA: Insufficient documentation

## 2016-01-18 DIAGNOSIS — L732 Hidradenitis suppurativa: Secondary | ICD-10-CM | POA: Diagnosis not present

## 2016-01-18 DIAGNOSIS — Z124 Encounter for screening for malignant neoplasm of cervix: Secondary | ICD-10-CM

## 2016-01-18 DIAGNOSIS — Z308 Encounter for other contraceptive management: Secondary | ICD-10-CM

## 2016-01-18 LAB — POCT WET PREP (WET MOUNT): CLUE CELLS WET PREP WHIFF POC: NEGATIVE

## 2016-01-18 MED ORDER — VENLAFAXINE HCL 75 MG PO TABS: 75.0000 mg | ORAL_TABLET | Freq: Two times a day (BID) | ORAL | Status: DC

## 2016-01-18 MED ORDER — ETONOGESTREL-ETHINYL ESTRADIOL 0.12-0.015 MG/24HR VA RING: VAGINAL_RING | VAGINAL | Status: DC

## 2016-01-18 MED ORDER — HYDROCHLOROTHIAZIDE 25 MG PO TABS: 25.0000 mg | ORAL_TABLET | Freq: Every day | ORAL | Status: DC

## 2016-01-18 NOTE — Patient Instructions (Addendum)
DASH Eating Plan DASH stands for "Dietary Approaches to Stop Hypertension." The DASH eating plan is a healthy eating plan that has been shown to reduce high blood pressure (hypertension). Additional health benefits may include reducing the risk of type 2 diabetes mellitus, heart disease, and stroke. The DASH eating plan may also help with weight loss. WHAT DO I NEED TO KNOW ABOUT THE DASH EATING PLAN? For the DASH eating plan, you will follow these general guidelines:  Choose foods with a percent daily value for sodium of less than 5% (as listed on the food label).  Use salt-free seasonings or herbs instead of table salt or sea salt.  Check with your health care provider or pharmacist before using salt substitutes.  Eat lower-sodium products, often labeled as "lower sodium" or "no salt added."  Eat fresh foods.  Eat more vegetables, fruits, and low-fat dairy products.  Choose whole grains. Look for the word "whole" as the first word in the ingredient list.  Choose fish and skinless chicken or [place] more often than red meat. Limit fish, poultry, and meat to 6 oz (170 g) each day.  Limit sweets, desserts, sugars, and sugary drinks.  Choose heart-healthy fats.  Limit cheese to 1 oz (28 g) per day.  Eat more home-cooked food and less restaurant, buffet, and fast food.  Limit fried foods.  Cook foods using methods other than frying.  Limit canned vegetables. If you do use them, rinse them well to decrease the sodium.  When eating at a restaurant, ask that your food be prepared with less salt, or no salt if possible. WHAT FOODS CAN I EAT? Seek help from a dietitian for individual calorie needs. Grains Whole grain or whole wheat bread. Brown rice. Whole grain or whole wheat pasta. Quinoa, bulgur, and whole grain cereals. Low-sodium cereals. Corn or whole wheat flour tortillas. Whole grain cornbread. Whole grain crackers. Low-sodium crackers. Vegetables Fresh or frozen vegetables  (raw, steamed, roasted, or grilled). Low-sodium or reduced-sodium tomato and vegetable juices. Low-sodium or reduced-sodium tomato sauce and paste. Low-sodium or reduced-sodium canned vegetables.  Fruits All fresh, canned (in natural juice), or frozen fruits. Meat and Other Protein Products Ground beef (85% or leaner), grass-fed beef, or beef trimmed of fat. Skinless chicken or [place]. Ground chicken or [place]. Pork trimmed of fat. All fish and seafood. Eggs. Dried beans, peas, or lentils. Unsalted nuts and seeds. Unsalted canned beans. Dairy Low-fat dairy products, such as skim or 1% milk, 2% or reduced-fat cheeses, low-fat ricotta or cottage cheese, or plain low-fat yogurt. Low-sodium or reduced-sodium cheeses. Fats and Oils Tub margarines without trans fats. Light or reduced-fat mayonnaise and salad dressings (reduced sodium). Avocado. Safflower, olive, or canola oils. Natural peanut or almond butter. Other Unsalted popcorn and pretzels. The items listed above may not be a complete list of recommended foods or beverages. Contact your dietitian for more options. WHAT FOODS ARE NOT RECOMMENDED? Grains White bread. White pasta. White rice. Refined cornbread. Bagels and croissants. Crackers that contain trans fat. Vegetables Creamed or fried vegetables. Vegetables in a cheese sauce. Regular canned vegetables. Regular canned tomato sauce and paste. Regular tomato and vegetable juices. Fruits Dried fruits. Canned fruit in light or heavy syrup. Fruit juice. Meat and Other Protein Products Fatty cuts of meat. Ribs, chicken wings, bacon, sausage, bologna, salami, chitterlings, fatback, hot dogs, bratwurst, and packaged luncheon meats. Salted nuts and seeds. Canned beans with salt. Dairy Whole or 2% milk, cream, half-and-half, and cream cheese. Whole-fat or sweetened yogurt. Full-fat  cheeses or blue cheese. Nondairy creamers and whipped toppings. Processed cheese, cheese spreads, or cheese  curds. Condiments Onion and garlic salt, seasoned salt, table salt, and sea salt. Canned and packaged gravies. Worcestershire sauce. Tartar sauce. Barbecue sauce. Teriyaki sauce. Soy sauce, including reduced sodium. Steak sauce. Fish sauce. Oyster sauce. Cocktail sauce. Horseradish. Ketchup and mustard. Meat flavorings and tenderizers. Bouillon cubes. Hot sauce. Tabasco sauce. Marinades. Taco seasonings. Relishes. Fats and Oils Butter, stick margarine, lard, shortening, ghee, and bacon fat. Coconut, palm kernel, or palm oils. Regular salad dressings. Other Pickles and olives. Salted popcorn and pretzels. The items listed above may not be a complete list of foods and beverages to avoid. Contact your dietitian for more information. WHERE CAN I FIND MORE INFORMATION? National Heart, Lung, and [employer] Institute: travelstabloid.com   This information is not intended to replace advice given to you by your health care provider. Make sure you discuss any questions you have with your health care provider.   Document Released: 11/29/2011 Document Revised: 12/31/2014 Document Reviewed: 10/14/2013 Elsevier Interactive Patient Education 2016 [place] for Adults, Female A healthy lifestyle and preventive care can promote health and wellness. Preventive health guidelines for women include the following key practices.  A routine yearly physical is a good way to check with your health care provider about your health and preventive screening. It is a chance to share any concerns and updates on your health and to receive a thorough exam.  Visit your dentist for a routine exam and preventive care every 6 months. Brush your teeth twice a day and floss once a day. Good oral hygiene prevents tooth decay and gum disease.  The frequency of eye exams is based on your age, health, family medical history, use of contact lenses, and other factors. Follow your health care  provider's recommendations for frequency of eye exams.  Eat a healthy diet. Foods like vegetables, fruits, whole grains, low-fat dairy products, and lean protein foods contain the nutrients you need without too many calories. Decrease your intake of foods high in solid fats, added sugars, and salt. Eat the right amount of calories for you.Get information about a proper diet from your health care provider, if necessary.  Regular physical exercise is one of the most important things you can do for your health. Most adults should get at least 150 minutes of moderate-intensity exercise (any activity that increases your heart rate and causes you to sweat) each week. In addition, most adults need muscle-strengthening exercises on 2 or more days a week.  Maintain a healthy weight. The body mass index (BMI) is a screening tool to identify possible weight problems. It provides an estimate of body fat based on height and weight. Your health care provider can find your BMI and can help you achieve or maintain a healthy weight.For adults 20 years and older:  A BMI below 18.5 is considered underweight.  A BMI of 18.5 to 24.9 is normal.  A BMI of 25 to 29.9 is considered overweight.  A BMI of 30 and above is considered obese.  Maintain normal blood lipids and cholesterol levels by exercising and minimizing your intake of saturated fat. Eat a balanced diet with plenty of fruit and vegetables. Blood tests for lipids and cholesterol should begin at age 73 and be repeated every 5 years. If your lipid or cholesterol levels are high, you are over 50, or you are at high risk for heart disease, you may need your cholesterol levels checked more  frequently.Ongoing high lipid and cholesterol levels should be treated with medicines if diet and exercise are not working.  If you smoke, find out from your health care provider how to quit. If you do not use tobacco, do not start.  Lung cancer screening is recommended for  adults aged 60-80 years who are at high risk for developing lung cancer because of a history of smoking. A yearly low-dose CT scan of the lungs is recommended for people who have at least a 30-pack-year history of smoking and are a current smoker or have quit within the past 15 years. A pack year of smoking is smoking an average of 1 pack of cigarettes a day for 1 year (for example: 1 pack a day for 30 years or 2 packs a day for 15 years). Yearly screening should continue until the smoker has stopped smoking for at least 15 years. Yearly screening should be stopped for people who develop a health problem that would prevent them from having lung cancer treatment.  If you are pregnant, do not drink alcohol. If you are breastfeeding, be very cautious about drinking alcohol. If you are not pregnant and choose to drink alcohol, do not have more than 1 drink per day. One drink is considered to be 12 ounces (355 mL) of beer, 5 ounces (148 mL) of wine, or 1.5 ounces (44 mL) of liquor.  Avoid use of street drugs. Do not share needles with anyone. Ask for help if you need support or instructions about stopping the use of drugs.  High blood pressure causes heart disease and increases the risk of stroke. Your blood pressure should be checked at least every 1 to 2 years. Ongoing high blood pressure should be treated with medicines if weight loss and exercise do not work.  If you are 22-57 years old, ask your health care provider if you should take aspirin to prevent strokes.  Diabetes screening is done by taking a blood sample to check your blood glucose level after you have not eaten for a certain period of time (fasting). If you are not overweight and you do not have risk factors for diabetes, you should be screened once every 3 years starting at age 56. If you are overweight or obese and you are 82-74 years of age, you should be screened for diabetes every year as part of your cardiovascular risk  assessment.  Breast cancer screening is essential preventive care for women. You should practice "breast self-awareness." This means understanding the normal appearance and feel of your breasts and may include breast self-examination. Any changes detected, no matter how small, should be reported to a health care provider. Women in their 54s and 30s should have a clinical breast exam (CBE) by a health care provider as part of a regular health exam every 1 to 3 years. After age 8, women should have a CBE every year. Starting at age 32, women should consider having a mammogram (breast X-ray test) every year. Women who have a family history of breast cancer should talk to their health care provider about genetic screening. Women at a high risk of breast cancer should talk to their health care providers about having an MRI and a mammogram every year.  Breast cancer gene (BRCA)-related cancer risk assessment is recommended for women who have family members with BRCA-related cancers. BRCA-related cancers include breast, ovarian, tubal, and peritoneal cancers. Having family members with these cancers may be associated with an increased risk for harmful changes (mutations) in  the breast cancer genes BRCA1 and BRCA2. Results of the assessment will determine the need for genetic counseling and BRCA1 and BRCA2 testing.  Your health care provider may recommend that you be screened regularly for cancer of the pelvic organs (ovaries, uterus, and vagina). This screening involves a pelvic examination, including checking for microscopic changes to the surface of your cervix (Pap test). You may be encouraged to have this screening done every 3 years, beginning at age 92.  For women ages 38-65, health care providers may recommend pelvic exams and Pap testing every 3 years, or they may recommend the Pap and pelvic exam, combined with testing for human papilloma virus (HPV), every 5 years. Some types of HPV increase your risk of  cervical cancer. Testing for HPV may also be done on women of any age with unclear Pap test results.  Other health care providers may not recommend any screening for nonpregnant women who are considered low risk for pelvic cancer and who do not have symptoms. Ask your health care provider if a screening pelvic exam is right for you.  If you have had past treatment for cervical cancer or a condition that could lead to cancer, you need Pap tests and screening for cancer for at least 20 years after your treatment. If Pap tests have been discontinued, your risk factors (such as having a new sexual partner) need to be reassessed to determine if screening should resume. Some women have medical problems that increase the chance of getting cervical cancer. In these cases, your health care provider may recommend more frequent screening and Pap tests.  Colorectal cancer can be detected and often prevented. Most routine colorectal cancer screening begins at the age of 31 years and continues through age 31 years. However, your health care provider may recommend screening at an earlier age if you have risk factors for colon cancer. On a yearly basis, your health care provider may provide home test kits to check for hidden blood in the stool. Use of a small camera at the end of a tube, to directly examine the colon (sigmoidoscopy or colonoscopy), can detect the earliest forms of colorectal cancer. Talk to your health care provider about this at age 40, when routine screening begins. Direct exam of the colon should be repeated every 5-10 years through age 20 years, unless early forms of precancerous polyps or small growths are found.  People who are at an increased risk for hepatitis B should be screened for this virus. You are considered at high risk for hepatitis B if:  You were born in a country where hepatitis B occurs often. Talk with your health care provider about which countries are considered high risk.  Your  parents were born in a high-risk country and you have not received a shot to protect against hepatitis B (hepatitis B vaccine).  You have HIV or AIDS.  You use needles to inject street drugs.  You live with, or have sex with, someone who has hepatitis B.  You get hemodialysis treatment.  You take certain medicines for conditions like cancer, organ transplantation, and autoimmune conditions.  Hepatitis C blood testing is recommended for all people born from [birth year] through 1964 and any individual with known risks for hepatitis C.  Practice safe sex. Use condoms and avoid high-risk sexual practices to reduce the spread of sexually transmitted infections (STIs). STIs include gonorrhea, chlamydia, syphilis, trichomonas, herpes, HPV, and human immunodeficiency virus (HIV). Herpes, HIV, and HPV are viral illnesses that have  no cure. They can result in disability, cancer, and death.  You should be screened for sexually transmitted illnesses (STIs) including gonorrhea and chlamydia if:  You are sexually active and are younger than 24 years.  You are older than 24 years and your health care provider tells you that you are at risk for this type of infection.  Your sexual activity has changed since you were last screened and you are at an increased risk for chlamydia or gonorrhea. Ask your health care provider if you are at risk.  If you are at risk of being infected with HIV, it is recommended that you take a prescription medicine daily to prevent HIV infection. This is called preexposure prophylaxis (PrEP). You are considered at risk if:  You are sexually active and do not regularly use condoms or know the HIV status of your partner(s).  You take drugs by injection.  You are sexually active with a partner who has HIV.  Talk with your health care provider about whether you are at high risk of being infected with HIV. If you choose to begin PrEP, you should first be tested for HIV. You should then  be tested every 3 months for as long as you are taking PrEP.  Osteoporosis is a disease in which the bones lose minerals and strength with aging. This can result in serious bone fractures or breaks. The risk of osteoporosis can be identified using a bone density scan. Women ages 16 years and over and women at risk for fractures or osteoporosis should discuss screening with their health care providers. Ask your health care provider whether you should take a calcium supplement or vitamin D to reduce the rate of osteoporosis.  Menopause can be associated with physical symptoms and risks. Hormone replacement therapy is available to decrease symptoms and risks. You should talk to your health care provider about whether hormone replacement therapy is right for you.  Use sunscreen. Apply sunscreen liberally and repeatedly throughout the day. You should seek shade when your shadow is shorter than you. Protect yourself by wearing long sleeves, pants, a wide-brimmed hat, and sunglasses year round, whenever you are outdoors.  Once a month, do a whole body skin exam, using a mirror to look at the skin on your back. Tell your health care provider of new moles, moles that have irregular borders, moles that are larger than a pencil eraser, or moles that have changed in shape or color.  Stay current with required vaccines (immunizations).  Influenza vaccine. All adults should be immunized every year.  Tetanus, diphtheria, and acellular pertussis (Td, Tdap) vaccine. Pregnant women should receive 1 dose of Tdap vaccine during each pregnancy. The dose should be obtained regardless of the length of time since the last dose. Immunization is preferred during the 27th-36th week of gestation. An adult who has not previously received Tdap or who does not know her vaccine status should receive 1 dose of Tdap. This initial dose should be followed by tetanus and diphtheria toxoids (Td) booster doses every 10 years. Adults with an  unknown or incomplete history of completing a 3-dose immunization series with Td-containing vaccines should begin or complete a primary immunization series including a Tdap dose. Adults should receive a Td booster every 10 years.  Varicella vaccine. An adult without evidence of immunity to varicella should receive 2 doses or a second dose if she has previously received 1 dose. Pregnant females who do not have evidence of immunity should receive the first dose  after pregnancy. This first dose should be obtained before leaving the health care facility. The second dose should be obtained 4-8 weeks after the first dose.  Human papillomavirus (HPV) vaccine. Females aged 13-26 years who have not received the vaccine previously should obtain the 3-dose series. The vaccine is not recommended for use in pregnant females. However, pregnancy testing is not needed before receiving a dose. If a female is found to be pregnant after receiving a dose, no treatment is needed. In that case, the remaining doses should be delayed until after the pregnancy. Immunization is recommended for any person with an immunocompromised condition through the age of 1 years if she did not get any or all doses earlier. During the 3-dose series, the second dose should be obtained 4-8 weeks after the first dose. The third dose should be obtained 24 weeks after the first dose and 16 weeks after the second dose.  Zoster vaccine. One dose is recommended for adults aged 57 years or older unless certain conditions are present.  Measles, mumps, and rubella (MMR) vaccine. Adults born before [birth year] generally are considered immune to measles and mumps. Adults born in [birth year] or later should have 1 or more doses of MMR vaccine unless there is a contraindication to the vaccine or there is laboratory evidence of immunity to each of the three diseases. A routine second dose of MMR vaccine should be obtained at least 28 days after the first dose for students  attending postsecondary schools, health care workers, or international travelers. People who received inactivated measles vaccine or an unknown type of measles vaccine during 1963-1967 should receive 2 doses of MMR vaccine. People who received inactivated mumps vaccine or an unknown type of mumps vaccine before 1979 and are at high risk for mumps infection should consider immunization with 2 doses of MMR vaccine. For females of childbearing age, rubella immunity should be determined. If there is no evidence of immunity, females who are not pregnant should be vaccinated. If there is no evidence of immunity, females who are pregnant should delay immunization until after pregnancy. Unvaccinated health care workers born before [birth year] who lack laboratory evidence of measles, mumps, or rubella immunity or laboratory confirmation of disease should consider measles and mumps immunization with 2 doses of MMR vaccine or rubella immunization with 1 dose of MMR vaccine.  Pneumococcal 13-valent conjugate (PCV13) vaccine. When indicated, a person who is uncertain of his immunization history and has no record of immunization should receive the PCV13 vaccine. All adults 27 years of age and older should receive this vaccine. An adult aged 23 years or older who has certain medical conditions and has not been previously immunized should receive 1 dose of PCV13 vaccine. This PCV13 should be followed with a dose of pneumococcal polysaccharide (PPSV23) vaccine. Adults who are at high risk for pneumococcal disease should obtain the PPSV23 vaccine at least 8 weeks after the dose of PCV13 vaccine. Adults older than 34 years of age who have normal immune system function should obtain the PPSV23 vaccine dose at least 1 year after the dose of PCV13 vaccine.  Pneumococcal polysaccharide (PPSV23) vaccine. When PCV13 is also indicated, PCV13 should be obtained first. All adults aged 90 years and older should be immunized. An adult younger than  age 28 years who has certain medical conditions should be immunized. Any person who resides in a nursing home or long-term care facility should be immunized. An adult smoker should be immunized. People with an immunocompromised condition and certain  other conditions should receive both PCV13 and PPSV23 vaccines. People with human immunodeficiency virus (HIV) infection should be immunized as soon as possible after diagnosis. Immunization during chemotherapy or radiation therapy should be avoided. Routine use of PPSV23 vaccine is not recommended for American Indians, [place]atives, or people younger than 65 years unless there are medical conditions that require PPSV23 vaccine. When indicated, people who have unknown immunization and have no record of immunization should receive PPSV23 vaccine. One-time revaccination 5 years after the first dose of PPSV23 is recommended for people aged 19-64 years who have chronic kidney failure, nephrotic syndrome, asplenia, or immunocompromised conditions. People who received 1-2 doses of PPSV23 before age 84 years should receive another dose of PPSV23 vaccine at age 69 years or later if at least 5 years have passed since the previous dose. Doses of PPSV23 are not needed for people immunized with PPSV23 at or after age 74 years.  Meningococcal vaccine. Adults with asplenia or persistent complement component deficiencies should receive 2 doses of quadrivalent meningococcal conjugate (MenACWY-D) vaccine. The doses should be obtained at least 2 months apart. Microbiologists working with certain meningococcal bacteria, [employer] [place] recruits, people at risk during an outbreak, and people who travel to or live in countries with a high rate of meningitis should be immunized. A first-year college student up through age 51 years who is living in a residence hall should receive a dose if she did not receive a dose on or after her 16th birthday. Adults who have certain high-risk conditions  should receive one or more doses of vaccine.  Hepatitis A vaccine. Adults who wish to be protected from this disease, have certain high-risk conditions, work with hepatitis A-infected animals, work in hepatitis A research labs, or travel to or work in countries with a high rate of hepatitis A should be immunized. Adults who were previously unvaccinated and who anticipate close contact with an international adoptee during the first 60 days after arrival in the [place] from a country with a high rate of hepatitis A should be immunized.  Hepatitis B vaccine. Adults who wish to be protected from this disease, have certain high-risk conditions, may be exposed to blood or other infectious body fluids, are household contacts or sex partners of hepatitis B positive people, are clients or workers in certain care facilities, or travel to or work in countries with a high rate of hepatitis B should be immunized.  Haemophilus influenzae type b (Hib) vaccine. A previously unvaccinated person with asplenia or sickle cell disease or having a scheduled splenectomy should receive 1 dose of Hib vaccine. Regardless of previous immunization, a recipient of a hematopoietic stem cell transplant should receive a 3-dose series 6-12 months after her successful transplant. Hib vaccine is not recommended for adults with HIV infection. Preventive Services / Frequency Ages 92 to 25 years  Blood pressure check.** / Every 3-5 years.  Lipid and cholesterol check.** / Every 5 years beginning at age 56.  Clinical breast exam.** / Every 3 years for women in their 58s and 39s.  BRCA-related cancer risk assessment.** / For women who have family members with a BRCA-related cancer (breast, ovarian, tubal, or peritoneal cancers).  Pap test.** / Every 2 years from ages 76 through 83. Every 3 years starting at age 44 through age 41 or 72 with a history of 3 consecutive normal Pap tests.  HPV screening.** / Every 3 years from ages 32  through ages 74 to 24 with a history of 3 consecutive  normal Pap tests.  Hepatitis C blood test.** / For any individual with known risks for hepatitis C.  Skin self-exam. / Monthly.  Influenza vaccine. / Every year.  Tetanus, diphtheria, and acellular pertussis (Tdap, Td) vaccine.** / Consult your health care provider. Pregnant women should receive 1 dose of Tdap vaccine during each pregnancy. 1 dose of Td every 10 years.  Varicella vaccine.** / Consult your health care provider. Pregnant females who do not have evidence of immunity should receive the first dose after pregnancy.  HPV vaccine. / 3 doses over 6 months, if 67 and younger. The vaccine is not recommended for use in pregnant females. However, pregnancy testing is not needed before receiving a dose.  Measles, mumps, rubella (MMR) vaccine.** / You need at least 1 dose of MMR if you were born in 1957 or later. You may also need a 2nd dose. For females of childbearing age, rubella immunity should be determined. If there is no evidence of immunity, females who are not pregnant should be vaccinated. If there is no evidence of immunity, females who are pregnant should delay immunization until after pregnancy.  Pneumococcal 13-valent conjugate (PCV13) vaccine.** / Consult your health care provider.  Pneumococcal polysaccharide (PPSV23) vaccine.** / 1 to 2 doses if you smoke cigarettes or if you have certain conditions.  Meningococcal vaccine.** / 1 dose if you are age 61 to 31 years and a Market researcher living in a residence hall, or have one of several medical conditions, you need to get vaccinated against meningococcal disease. You may also need additional booster doses.  Hepatitis A vaccine.** / Consult your health care provider.  Hepatitis B vaccine.** / Consult your health care provider.  Haemophilus influenzae type b (Hib) vaccine.** / Consult your health care provider. Ages 67 to 72 years  Blood pressure check.** /  Every year.  Lipid and cholesterol check.** / Every 5 years beginning at age 58 years.  Lung cancer screening. / Every year if you are aged 66-80 years and have a 30-pack-year history of smoking and currently smoke or have quit within the past 15 years. Yearly screening is stopped once you have quit smoking for at least 15 years or develop a health problem that would prevent you from having lung cancer treatment.  Clinical breast exam.** / Every year after age 83 years.  BRCA-related cancer risk assessment.** / For women who have family members with a BRCA-related cancer (breast, ovarian, tubal, or peritoneal cancers).  Mammogram.** / Every year beginning at age 51 years and continuing for as long as you are in good health. Consult with your health care provider.  Pap test.** / Every 3 years starting at age 74 years through age 83 or 57 years with a history of 3 consecutive normal Pap tests.  HPV screening.** / Every 3 years from ages 1 years through ages 83 to 14 years with a history of 3 consecutive normal Pap tests.  Fecal occult blood test (FOBT) of stool. / Every year beginning at age 57 years and continuing until age 25 years. You may not need to do this test if you get a colonoscopy every 10 years.  Flexible sigmoidoscopy or colonoscopy.** / Every 5 years for a flexible sigmoidoscopy or every 10 years for a colonoscopy beginning at age 45 years and continuing until age 67 years.  Hepatitis C blood test.** / For all people born from [birth year] through 1964 and any individual with known risks for hepatitis C.  Skin self-exam. / Monthly.  Influenza vaccine. / Every year.  Tetanus, diphtheria, and acellular pertussis (Tdap/Td) vaccine.** / Consult your health care provider. Pregnant women should receive 1 dose of Tdap vaccine during each pregnancy. 1 dose of Td every 10 years.  Varicella vaccine.** / Consult your health care provider. Pregnant females who do not have evidence of immunity  should receive the first dose after pregnancy.  Zoster vaccine.** / 1 dose for adults aged 14 years or older.  Measles, mumps, rubella (MMR) vaccine.** / You need at least 1 dose of MMR if you were born in 1957 or later. You may also need a second dose. For females of childbearing age, rubella immunity should be determined. If there is no evidence of immunity, females who are not pregnant should be vaccinated. If there is no evidence of immunity, females who are pregnant should delay immunization until after pregnancy.  Pneumococcal 13-valent conjugate (PCV13) vaccine.** / Consult your health care provider.  Pneumococcal polysaccharide (PPSV23) vaccine.** / 1 to 2 doses if you smoke cigarettes or if you have certain conditions.  Meningococcal vaccine.** / Consult your health care provider.  Hepatitis A vaccine.** / Consult your health care provider.  Hepatitis B vaccine.** / Consult your health care provider.  Haemophilus influenzae type b (Hib) vaccine.** / Consult your health care provider. Ages 31 years and over  Blood pressure check.** / Every year.  Lipid and cholesterol check.** / Every 5 years beginning at age 6 years.  Lung cancer screening. / Every year if you are aged 69-80 years and have a 30-pack-year history of smoking and currently smoke or have quit within the past 15 years. Yearly screening is stopped once you have quit smoking for at least 15 years or develop a health problem that would prevent you from having lung cancer treatment.  Clinical breast exam.** / Every year after age 1 years.  BRCA-related cancer risk assessment.** / For women who have family members with a BRCA-related cancer (breast, ovarian, tubal, or peritoneal cancers).  Mammogram.** / Every year beginning at age 41 years and continuing for as long as you are in good health. Consult with your health care provider.  Pap test.** / Every 3 years starting at age 21 years through age 37 or 36 years with  3 consecutive normal Pap tests. Testing can be stopped between 65 and 70 years with 3 consecutive normal Pap tests and no abnormal Pap or HPV tests in the past 10 years.  HPV screening.** / Every 3 years from ages 58 years through ages 60 or 24 years with a history of 3 consecutive normal Pap tests. Testing can be stopped between 65 and 70 years with 3 consecutive normal Pap tests and no abnormal Pap or HPV tests in the past 10 years.  Fecal occult blood test (FOBT) of stool. / Every year beginning at age 55 years and continuing until age 50 years. You may not need to do this test if you get a colonoscopy every 10 years.  Flexible sigmoidoscopy or colonoscopy.** / Every 5 years for a flexible sigmoidoscopy or every 10 years for a colonoscopy beginning at age 45 years and continuing until age 55 years.  Hepatitis C blood test.** / For all people born from [birth year] through 1964 and any individual with known risks for hepatitis C.  Osteoporosis screening.** / A one-time screening for women ages 65 years and over and women at risk for fractures or osteoporosis.  Skin self-exam. / Monthly.  Influenza vaccine. / Every year.  Tetanus,  diphtheria, and acellular pertussis (Tdap/Td) vaccine.** / 1 dose of Td every 10 years.  Varicella vaccine.** / Consult your health care provider.  Zoster vaccine.** / 1 dose for adults aged 78 years or older.  Pneumococcal 13-valent conjugate (PCV13) vaccine.** / Consult your health care provider.  Pneumococcal polysaccharide (PPSV23) vaccine.** / 1 dose for all adults aged 46 years and older.  Meningococcal vaccine.** / Consult your health care provider.  Hepatitis A vaccine.** / Consult your health care provider.  Hepatitis B vaccine.** / Consult your health care provider.  Haemophilus influenzae type b (Hib) vaccine.** / Consult your health care provider. ** Family history and personal history of risk and conditions may change your health care provider's  recommendations.   This information is not intended to replace advice given to you by your health care provider. Make sure you discuss any questions you have with your health care provider.   Document Released: 02/05/2002 Document Revised: 12/31/2014 Document Reviewed: 05/07/2011 Elsevier Interactive Patient Education 2016 Elsevier [employer] Major Depressive Disorder Major depressive disorder is a mental illness. It also may be called clinical depression or unipolar depression. Major depressive disorder usually causes feelings of sadness, hopelessness, or helplessness. Some people with this disorder do not feel particularly sad but lose interest in doing things they used to enjoy (anhedonia). Major depressive disorder also can cause physical symptoms. It can interfere with work, school, relationships, and other normal everyday activities. The disorder varies in severity but is longer lasting and more serious than the sadness we all feel from time to time in our lives. Major depressive disorder often is triggered by stressful life events or major life changes. Examples of these triggers include divorce, loss of your job or home, a move, and the death of a family member or close friend. Sometimes this disorder occurs for no obvious reason at all. People who have family members with major depressive disorder or bipolar disorder are at higher risk for developing this disorder, with or without life stressors. Major depressive disorder can occur at any age. It may occur just once in your life (single episode major depressive disorder). It may occur multiple times (recurrent major depressive disorder). SYMPTOMS People with major depressive disorder have either anhedonia or depressed mood on nearly a daily basis for at least 2 weeks or longer. Symptoms of depressed mood include:  Feelings of sadness (blue or down in the dumps) or emptiness.  Feelings of hopelessness or helplessness.  Tearfulness or episodes of  crying (may be observed by others).  Irritability (children and adolescents). In addition to depressed mood or anhedonia or both, people with this disorder have at least four of the following symptoms:  Difficulty sleeping or sleeping too much.   Significant change (increase or decrease) in appetite or weight.   Lack of energy or motivation.  Feelings of guilt and worthlessness.   Difficulty concentrating, remembering, or making decisions.  Unusually slow movement (psychomotor retardation) or restlessness (as observed by others).   Recurrent wishes for death, recurrent thoughts of self-harm (suicide), or a suicide attempt. People with major depressive disorder commonly have persistent negative thoughts about themselves, other people, and the world. People with severe major depressive disorder may experiencedistorted beliefs or perceptions about the world (psychotic delusions). They also may see or hear things that are not real (psychotic hallucinations). DIAGNOSIS Major depressive disorder is diagnosed through an assessment by your health care provider. Your health care provider will ask aboutaspects of your daily life, such as mood,sleep, and appetite,  to see if you have the diagnostic symptoms of major depressive disorder. Your health care provider may ask about your medical history and use of alcohol or drugs, including prescription medicines. Your health care provider also may do a physical exam and blood work. This is because certain medical conditions and the use of certain substances can cause major depressive disorder-like symptoms (secondary depression). Your health care provider also may refer you to a mental health specialist for further evaluation and treatment. TREATMENT It is important to recognize the symptoms of major depressive disorder and seek treatment. The following treatments can be prescribed for this disorder:   Medicine. Antidepressant medicines usually are  prescribed. Antidepressant medicines are thought to correct chemical imbalances in the brain that are commonly associated with major depressive disorder. Other types of medicine may be added if the symptoms do not respond to antidepressant medicines alone or if psychotic delusions or hallucinations occur.  Talk therapy. Talk therapy can be helpful in treating major depressive disorder by providing support, education, and guidance. Certain types of talk therapy also can help with negative thinking (cognitive behavioral therapy) and with relationship issues that trigger this disorder (interpersonal therapy). A mental health specialist can help determine which treatment is best for you. Most people with major depressive disorder do well with a combination of medicine and talk therapy. Treatments involving electrical stimulation of the brain can be used in situations with extremely severe symptoms or when medicine and talk therapy do not work over time. These treatments include electroconvulsive therapy, transcranial magnetic stimulation, and vagal nerve stimulation.   This information is not intended to replace advice given to you by your health care provider. Make sure you discuss any questions you have with your health care provider.   Document Released: 04/06/2013 Document Revised: 12/31/2014 Document Reviewed: 04/06/2013 Elsevier Interactive Patient Education [employer].

## 2016-01-18 NOTE — Progress Notes (Signed)
Subjective:     Sandra Pittman is a 35 y.o. female and is here for a comprehensive physical exam. The patient reports problems - depression. PHQ-9 is 17. Not sleeping well. Has been out of meds due to no insurance.  Still on OC's. She is not looking to have a kid right now. She has not had apap smear since 2013.  Social History   Social History  . Marital Status: Single    Spouse Name: N/A  . Number of Children: N/A  . Years of Education: N/A   Occupational History  . Not on file.   Social History Main Topics  . Smoking status: Current Every Day Smoker -- 0.50 packs/day for .3 years    Types: Cigarettes, Cigars  . Smokeless tobacco: Never Used     Comment: 8 cigs per day  . Alcohol Use: Yes     Comment: occ  . Drug Use: No  . Sexual Activity: Not on file   Other Topics Concern  . Not on file   Social History Narrative   Health Maintenance  Topic Date Due  . PAP SMEAR  01/21/2015  . INFLUENZA VACCINE  07/25/2015  . TETANUS/TDAP  01/21/2022  . HIV Screening  Completed    The following portions of the patient's history were reviewed and updated as appropriate: current medications, past family history, past medical history, past social history, past surgical history and problem list.  Review of Systems Pertinent items noted in HPI and remainder of comprehensive ROS otherwise negative.   Objective:    BP 160/106 mmHg  Pulse 83  Temp(Src) 98.5 F (36.9 C) (Oral)  Ht  (1.575 m)  Wt 193 lb 4.8 oz (87.68 kg)  BMI 35.35 kg/m2  LMP 12/30/2015 General appearance: alert, cooperative and appears stated age Head: Normocephalic, without obvious abnormality, atraumatic Neck: no adenopathy, supple, symmetrical, trachea midline and thyroid not enlarged, symmetric, no tenderness/mass/nodules Lungs: clear to auscultation bilaterally Breasts: normal appearance, no masses or tenderness Heart: regular rate and rhythm, S1, S2 normal, no murmur, click, rub or gallop Abdomen:  soft, non-tender; bowel sounds normal; no masses,  no organomegaly Pelvic: cervix normal in appearance, external genitalia normal, no adnexal masses or tenderness, no cervical motion tenderness, uterus normal size, shape, and consistency and vagina normal without discharge Extremities: extremities normal, atraumatic, no cyanosis or edema Pulses: 2+ and symmetric Skin: Skin color, texture, turgor normal. No rashes or lesions Lymph nodes: Cervical, supraclavicular, and axillary nodes normal. Neurologic: Grossly normal    Assessment:    GYN female exam.      Plan:  1. Hidradenitis Declines abx for now  2. INSOMNIA-SLEEP DISORDER-UNSPEC Probably related to her depression  3. HYPERTENSION Restart meds and f/u in 2 wks - hydrochlorothiazide (HYDRODIURIL) 25 MG tablet; Take 1 tablet (25 mg total) by mouth daily.  Dispense: 30 tablet; Refill: 3 - Comprehensive metabolic panel - Lipid panel  4. Severe episode of recurrent major depressive disorder, without psychotic features (HCC) Resume meds - venlafaxine (EFFEXOR) 75 MG tablet; Take 1 tablet (75 mg total) by mouth 2 (two) times daily.  Dispense: 60 tablet; Refill: 3  5. Encounter for other contraceptive management Switch to BP remains high--will need to  - etonogestrel-ethinyl estradiol (NUVARING) 0.12-0.015 MG/24HR vaginal ring; Insert vaginally and leave in place for 3 consecutive weeks, then remove for 1 week.  Dispense: 1 each; Refill: 12  6. Screen for STD (sexually transmitted disease) - HIV antibody - RPR - POCT Wet Prep [facility]) -  GC/Chlamydia probe amp ([place])not at [facility]  7. Screening for cervical cancer - Cytology - PAP  8. Annual physical exam - TSH - CBC  9. Encounter for immunization Flu shot today    See After Visit Summary for Counseling Recommendations

## 2016-01-19 LAB — COMPREHENSIVE METABOLIC PANEL
ALBUMIN: 4.3 g/dL (ref 3.6–5.1)
ALT: 11 U/L (ref 6–29)
AST: 14 U/L (ref 10–30)
Alkaline Phosphatase: 68 U/L (ref 33–115)
BUN: 7 mg/dL (ref 7–25)
CALCIUM: 9.3 mg/dL (ref 8.6–10.2)
CHLORIDE: 102 mmol/L (ref 98–110)
CO2: 27 mmol/L (ref 20–31)
Creat: 0.74 mg/dL (ref 0.50–1.10)
Glucose, Bld: 88 mg/dL (ref 65–99)
Potassium: 4 mmol/L (ref 3.5–5.3)
Sodium: 139 mmol/L (ref 135–146)
Total Bilirubin: 0.2 mg/dL (ref 0.2–1.2)
Total Protein: 7.8 g/dL (ref 6.1–8.1)

## 2016-01-19 LAB — LIPID PANEL
CHOL/HDL RATIO: 2.6 ratio (ref <=5.0)
Cholesterol: 141 mg/dL (ref 125–200)
HDL: 54 mg/dL (ref >=46)
LDL CALC: 66 mg/dL (ref <130)
Triglycerides: 107 mg/dL (ref <150)
VLDL: 21 mg/dL (ref <30)

## 2016-01-19 LAB — CBC
HEMATOCRIT: 38 % (ref 36.0–46.0)
Hemoglobin: 12.2 g/dL (ref 12.0–15.0)
MCH: 30.3 pg (ref 26.0–34.0)
MCHC: 32.1 g/dL (ref 30.0–36.0)
MCV: 94.5 fL (ref 78.0–100.0)
MPV: 11.1 fL (ref 8.6–12.4)
PLATELETS: 396 10*3/uL (ref 150–400)
RBC: 4.02 MIL/uL (ref 3.87–5.11)
RDW: 13.2 % (ref 11.5–15.5)
WBC: 10.1 10*3/uL (ref 4.0–10.5)

## 2016-01-19 LAB — TSH: TSH: 1.223 u[IU]/mL (ref 0.350–4.500)

## 2016-01-19 LAB — RPR

## 2016-01-19 LAB — HIV ANTIBODY (ROUTINE TESTING W REFLEX): HIV 1&2 Ab, 4th Generation: NONREACTIVE

## 2016-01-20 LAB — CYTOLOGY - PAP

## 2016-01-20 LAB — GC/CHLAMYDIA PROBE AMP (~~LOC~~) NOT AT ARMC
CHLAMYDIA, DNA PROBE: NEGATIVE
Neisseria Gonorrhea: NEGATIVE

## 2016-02-14 ENCOUNTER — Emergency Department (HOSPITAL_BASED_OUTPATIENT_CLINIC_OR_DEPARTMENT_OTHER)
Admission: EM | Admit: 2016-02-14 | Discharge: 2016-02-15 | Disposition: A | Discharge status: Home / Self Care | Payer: BLUE CROSS/BLUE SHIELD | Attending: Emergency Medicine | Admitting: Emergency Medicine

## 2016-02-14 ENCOUNTER — Encounter (HOSPITAL_BASED_OUTPATIENT_CLINIC_OR_DEPARTMENT_OTHER): Payer: Self-pay | Admitting: Emergency Medicine

## 2016-02-14 ENCOUNTER — Emergency Department (HOSPITAL_COMMUNITY)
Admission: EM | Admit: 2016-02-14 | Discharge: 2016-02-14 | Disposition: A | Discharge status: Home / Self Care | Payer: BLUE CROSS/BLUE SHIELD | Source: Home / Self Care

## 2016-02-14 DIAGNOSIS — A599 Trichomoniasis, unspecified: Secondary | ICD-10-CM | POA: Diagnosis not present

## 2016-02-14 DIAGNOSIS — Z3202 Encounter for pregnancy test, result negative: Secondary | ICD-10-CM | POA: Insufficient documentation

## 2016-02-14 DIAGNOSIS — F329 Major depressive disorder, single episode, unspecified: Secondary | ICD-10-CM | POA: Diagnosis not present

## 2016-02-14 DIAGNOSIS — F17210 Nicotine dependence, cigarettes, uncomplicated: Secondary | ICD-10-CM | POA: Insufficient documentation

## 2016-02-14 DIAGNOSIS — R51 Headache: Secondary | ICD-10-CM | POA: Insufficient documentation

## 2016-02-14 DIAGNOSIS — I10 Essential (primary) hypertension: Secondary | ICD-10-CM | POA: Diagnosis not present

## 2016-02-14 DIAGNOSIS — Z79899 Other long term (current) drug therapy: Secondary | ICD-10-CM | POA: Insufficient documentation

## 2016-02-14 DIAGNOSIS — R519 Headache, unspecified: Secondary | ICD-10-CM

## 2016-02-14 DIAGNOSIS — Z793 Long term (current) use of hormonal contraceptives: Secondary | ICD-10-CM | POA: Insufficient documentation

## 2016-02-14 NOTE — ED Notes (Signed)
Nurse first-pt left [facility] ED due to long wait-pt NAD-delay in registration due to pt was still on [facility] ED track board-arrival time was adjusted to pt's true arrival time vs time she was arrived in [facility] to this ED

## 2016-02-14 NOTE — ED Notes (Signed)
Pt states that she has HTN but has been taking her medication normally. Has had HTN the past two days with headache. Alert and oriented. Neuro intact.

## 2016-02-14 NOTE — ED Notes (Signed)
Patient states that she had had a HA x 2 -3 days and has had High BP

## 2016-02-15 LAB — BASIC METABOLIC PANEL
ANION GAP: 8 (ref 5–15)
BUN: 7 mg/dL (ref 6–20)
CALCIUM: 9.2 mg/dL (ref 8.9–10.3)
CO2: 27 mmol/L (ref 22–32)
Chloride: 101 mmol/L (ref 101–111)
Creatinine, Ser: 0.8 mg/dL (ref 0.44–1.00)
GFR calc Af Amer: >60 mL/min (ref >60)
GFR calc non Af Amer: >60 mL/min (ref >60)
GLUCOSE: 96 mg/dL (ref 65–99)
Potassium: 3.3 mmol/L — ABNORMAL LOW (ref 3.5–5.1)
Sodium: 136 mmol/L (ref 135–145)

## 2016-02-15 LAB — URINALYSIS, ROUTINE W REFLEX MICROSCOPIC
BILIRUBIN URINE: NEGATIVE
GLUCOSE, UA: NEGATIVE mg/dL
Ketones, ur: 15 mg/dL — AB
NITRITE: NEGATIVE
PH: 6 (ref 5.0–8.0)
Protein, ur: NEGATIVE mg/dL
SPECIFIC GRAVITY, URINE: 1.023 (ref 1.005–1.030)

## 2016-02-15 LAB — CBC WITH DIFFERENTIAL/PLATELET
BASOS ABS: 0.1 10*3/uL (ref 0.0–0.1)
Basophils Relative: 0 %
EOS PCT: 1 %
Eosinophils Absolute: 0.1 10*3/uL (ref 0.0–0.7)
HEMATOCRIT: 37.4 % (ref 36.0–46.0)
Hemoglobin: 12.4 g/dL (ref 12.0–15.0)
LYMPHS ABS: 3.9 10*3/uL (ref 0.7–4.0)
LYMPHS PCT: 33 %
MCH: 30.7 pg (ref 26.0–34.0)
MCHC: 33.2 g/dL (ref 30.0–36.0)
MCV: 92.6 fL (ref 78.0–100.0)
MONO ABS: 0.8 10*3/uL (ref 0.1–1.0)
MONOS PCT: 7 %
NEUTROS ABS: 6.8 10*3/uL (ref 1.7–7.7)
Neutrophils Relative %: 59 %
PLATELETS: 303 10*3/uL (ref 150–400)
RBC: 4.04 MIL/uL (ref 3.87–5.11)
RDW: 12.8 % (ref 11.5–15.5)
WBC: 11.7 10*3/uL — ABNORMAL HIGH (ref 4.0–10.5)

## 2016-02-15 LAB — URINE MICROSCOPIC-ADD ON

## 2016-02-15 LAB — PREGNANCY, URINE: Preg Test, Ur: NEGATIVE

## 2016-02-15 MED ORDER — ENALAPRILAT 1.25 MG/ML IV SOLN
1.2500 mg | Freq: Once | INTRAVENOUS | Status: AC
  Administered 2016-02-15: 1.25 mg via INTRAVENOUS
  Filled 2016-02-15: qty 2

## 2016-02-15 MED ORDER — METOCLOPRAMIDE HCL 5 MG/ML IJ SOLN
10.0000 mg | Freq: Once | INTRAMUSCULAR | Status: AC
  Administered 2016-02-15: 10 mg via INTRAVENOUS
  Filled 2016-02-15: qty 2

## 2016-02-15 MED ORDER — ENALAPRIL MALEATE 10 MG PO TABS
10.0000 mg | ORAL_TABLET | Freq: Every day | ORAL | Status: DC
Start: 2016-02-15 — End: 2016-02-15

## 2016-02-15 MED ORDER — DIPHENHYDRAMINE HCL 50 MG/ML IJ SOLN
25.0000 mg | Freq: Once | INTRAMUSCULAR | Status: AC
  Administered 2016-02-15: 25 mg via INTRAVENOUS
  Filled 2016-02-15: qty 1

## 2016-02-15 MED ORDER — ENALAPRIL MALEATE 10 MG PO TABS: 10.0000 mg | ORAL_TABLET | Freq: Every day | ORAL | Status: DC

## 2016-02-15 MED ORDER — METRONIDAZOLE 500 MG PO TABS
2000.0000 mg | ORAL_TABLET | Freq: Once | ORAL | Status: AC
  Administered 2016-02-15: 2000 mg via ORAL
  Filled 2016-02-15: qty 4

## 2016-02-15 MED ORDER — SODIUM CHLORIDE 0.9 % IV BOLUS (SEPSIS)
1000.0000 mL | Freq: Once | INTRAVENOUS | Status: AC
  Administered 2016-02-15: 1000 mL via INTRAVENOUS

## 2016-02-15 NOTE — ED Provider Notes (Addendum)
CSN: [id number]     Arrival date & time 02/14/16  2105 History   First MD Initiated Contact with Patient 02/15/16 0004     Chief Complaint  Patient presents with  . Headache     (Consider location/radiation/quality/duration/timing/severity/associated sxs/prior Treatment) HPI  This is a 35 year old female who was recently diagnosed with hypertension and placed on hydrochlorothiazide 25 milligrams daily a month ago. She is here complaining of a headache for the past 2 days. The headache is located frontally and she describes it as a 9 out of 10. She states she gets headaches about 3 times a week but this is last longer than usual. There is no associated photophobia or nausea. She took ibuprofen yesterday without relief. She checked her blood pressure work and found it to be 201/135 despite being compliant with her hydrochlorothiazide. It was noted to be 181/99 on arrival. She denies chest pain or shortness of breath. She has had a decreased appetite.  Past Medical History  Diagnosis Date  . Depression   . Hypertension    Past Surgical History  Procedure Laterality Date  . Diagnostic laparoscopy    . Wisdom tooth extraction     Family History  Problem Relation Age of Onset  . Diabetes Mother    Social History  Substance Use Topics  . Smoking status: Current Every Day Smoker -- 0.50 packs/day for .3 years    Types: Cigarettes, Cigars  . Smokeless tobacco: Never Used     Comment: 8 cigs per day  . Alcohol Use: Yes     Comment: occ   OB History    No data available     Review of Systems  All other systems reviewed and are negative.   Allergies  Review of patient's allergies indicates no known allergies.  Home Medications   Prior to Admission medications   Medication Sig Start Date End Date Taking? Authorizing Provider  acetaminophen (TYLENOL) 500 MG tablet Take 1,000 mg by mouth every 6 (six) hours as needed for moderate pain or headache.    Historical Provider, MD   Amphetamine Sulfate (EVEKEO) 5 MG TABS Take 1 tablet by mouth daily. Reported on 01/18/2016    Historical Provider, MD  clindamycin (CLEOCIN-T) 1 % gel Apply topically 2 (two) times daily. Patient not taking: Reported on 01/18/2016 11/17/14   Abram Sander, MD  etonogestrel-ethinyl estradiol (NUVARING) 0.12-0.015 MG/24HR vaginal ring Insert vaginally and leave in place for 3 consecutive weeks, then remove for 1 week. 01/18/16   Reva Bores, MD  hydrochlorothiazide (HYDRODIURIL) 25 MG tablet Take 1 tablet (25 mg total) by mouth daily. 01/18/16   Reva Bores, MD  ibuprofen (ADVIL,MOTRIN) 200 MG tablet Take 600 mg by mouth every 6 (six) hours as needed for moderate pain.    Historical Provider, MD  Resorcinol-Sulfur 2-8 % CREA Apply 1 application topically as needed. Patient not taking: Reported on 01/18/2016 11/17/14   Abram Sander, MD  venlafaxine [facility]) 75 MG tablet Take 1 tablet (75 mg total) by mouth 2 (two) times daily. 01/18/16   Reva Bores, MD   BP 181/99 mmHg  Pulse 71  Temp(Src) 98.9 F (37.2 C) (Oral)  Resp 16  Ht  (1.575 m)  Wt 183 lb (83.008 kg)  BMI 33.46 kg/m2  SpO2 100%  LMP 01/23/2016 (Approximate)   Physical Exam  General: Well-developed, well-nourished female in no acute distress; appearance consistent with age of record HENT: normocephalic; atraumatic Eyes: pupils equal, round and reactive  to light; extraocular muscles intact Neck: supple Heart: regular rate and rhythm Lungs: clear to auscultation bilaterally Abdomen: soft; nondistended; nontender; no masses or hepatosplenomegaly; bowel sounds present Extremities: No deformity; full range of motion; pulses normal Neurologic: Awake, alert and oriented; motor function intact in all extremities and symmetric; no facial droop Skin: Warm and dry Psychiatric: Normal mood and affect    ED Course  Procedures (including critical care time)   MDM  Nursing notes and vitals signs, including pulse oximetry,  reviewed.  Summary of this visit's results, reviewed by myself:  Labs:  Results for orders placed or performed during the hospital encounter of 02/14/16 (from the past 24 hour(s))  CBC with Differential/Platelet     Status: Abnormal   Collection Time: 02/15/16 12:15 AM  Result Value Ref Range   WBC 11.7 (H) 4.0 - 10.5 K/uL   RBC 4.04 3.87 - 5.11 MIL/uL   Hemoglobin 12.4 12.0 - 15.0 g/dL   HCT 16.1 09.6 - 04.5 %   MCV 92.6 78.0 - 100.0 fL   MCH 30.7 26.0 - 34.0 pg   MCHC 33.2 30.0 - 36.0 g/dL   RDW 40.9 81.1 - 91.4 %   Platelets 303 150 - 400 K/uL   Neutrophils Relative % 59 %   Neutro Abs 6.8 1.7 - 7.7 K/uL   Lymphocytes Relative 33 %   Lymphs Abs 3.9 0.7 - 4.0 K/uL   Monocytes Relative 7 %   Monocytes Absolute 0.8 0.1 - 1.0 K/uL   Eosinophils Relative 1 %   Eosinophils Absolute 0.1 0.0 - 0.7 K/uL   Basophils Relative 0 %   Basophils Absolute 0.1 0.0 - 0.1 K/uL  Basic metabolic panel     Status: Abnormal   Collection Time: 02/15/16 12:15 AM  Result Value Ref Range   Sodium 136 135 - 145 mmol/L   Potassium 3.3 (L) 3.5 - 5.1 mmol/L   Chloride 101 101 - 111 mmol/L   CO2 27 22 - 32 mmol/L   Glucose, Bld 96 65 - 99 mg/dL   BUN 7 6 - 20 mg/dL   Creatinine, Ser 7.82 0.44 - 1.00 mg/dL   Calcium 9.2 8.9 - 95.6 mg/dL   GFR calc non Af Amer >60 >60 mL/min   GFR calc Af Amer >60 >60 mL/min   Anion gap 8 5 - 15  Pregnancy, urine     Status: None   Collection Time: 02/15/16 12:20 AM  Result Value Ref Range   Preg Test, Ur NEGATIVE NEGATIVE  Urinalysis, Routine w reflex microscopic (not at [facility])     Status: Abnormal   Collection Time: 02/15/16 12:20 AM  Result Value Ref Range   Color, Urine AMBER (A) YELLOW   APPearance CLOUDY (A) CLEAR   Specific Gravity, Urine 1.023 1.005 - 1.030   pH 6.0 5.0 - 8.0   Glucose, UA NEGATIVE NEGATIVE mg/dL   Hgb urine dipstick MODERATE (A) NEGATIVE   Bilirubin Urine NEGATIVE NEGATIVE   Ketones, ur 15 (A) NEGATIVE mg/dL   Protein, ur NEGATIVE  NEGATIVE mg/dL   Nitrite NEGATIVE NEGATIVE   Leukocytes, UA MODERATE (A) NEGATIVE  Urine microscopic-add on     Status: Abnormal   Collection Time: 02/15/16 12:20 AM  Result Value Ref Range   Squamous Epithelial / LPF 6-30 (A) NONE SEEN   WBC, UA 6-30 0 - 5 WBC/hpf   RBC / HPF 6-30 0 - 5 RBC/hpf   Bacteria, UA FEW (A) NONE SEEN   Urine-Other TRICHOMONAS PRESENT  1:48 AM Headache and blood pressure improved after IV medications. Patient advised of diagnosis of trichomoniasis. We will start her on enalapril 10 milligrams daily and have her follow-up with Dr. Shawnie Pons. Urine sent for culture, will hold treatment as patient is asymptomatic.  Paula Libra, MD 02/15/16 [id number]  Paula Libra, MD 02/15/16 1914  Paula Libra, MD 02/15/16 [phone]

## 2016-02-15 NOTE — ED Notes (Signed)
MD at bedside discussing test results and dispo plan of care. 

## 2016-02-15 NOTE — ED Notes (Signed)
MD at bedside. 

## 2016-02-16 ENCOUNTER — Encounter: Payer: Self-pay | Admitting: Family Medicine

## 2016-02-16 ENCOUNTER — Ambulatory Visit (INDEPENDENT_AMBULATORY_CARE_PROVIDER_SITE_OTHER): Payer: BLUE CROSS/BLUE SHIELD | Admitting: Family Medicine

## 2016-02-16 VITALS — BP 142/90 | HR 93 | Temp 98.3°F | Ht 62.0 in | Wt 186.0 lb

## 2016-02-16 DIAGNOSIS — F332 Major depressive disorder, recurrent severe without psychotic features: Secondary | ICD-10-CM | POA: Diagnosis not present

## 2016-02-16 DIAGNOSIS — I10 Essential (primary) hypertension: Secondary | ICD-10-CM

## 2016-02-16 DIAGNOSIS — R51 Headache: Secondary | ICD-10-CM | POA: Diagnosis not present

## 2016-02-16 DIAGNOSIS — R519 Headache, unspecified: Secondary | ICD-10-CM

## 2016-02-16 LAB — URINE CULTURE

## 2016-02-16 MED ORDER — ENALAPRIL MALEATE 20 MG PO TABS: 20.0000 mg | ORAL_TABLET | Freq: Every day | ORAL | Status: DC

## 2016-02-16 MED ORDER — CYCLOBENZAPRINE HCL 10 MG PO TABS
10.0000 mg | ORAL_TABLET | Freq: Three times a day (TID) | ORAL | Status: DC | PRN
Start: 2016-02-16 — End: 2018-01-02

## 2016-02-16 MED ORDER — SERTRALINE HCL 50 MG PO TABS: 50.0000 mg | ORAL_TABLET | Freq: Every day | ORAL | Status: DC

## 2016-02-16 NOTE — Assessment & Plan Note (Addendum)
Change to zoloft. Begin at 51 and taper up prn

## 2016-02-16 NOTE — Patient Instructions (Signed)
DASH Eating Plan  DASH stands for "Dietary Approaches to Stop Hypertension." The DASH eating plan is a healthy eating plan that has been shown to reduce high blood pressure (hypertension). Additional health benefits may include reducing the risk of type 2 diabetes mellitus, heart disease, and stroke. The DASH eating plan may also help with weight loss.  WHAT DO I NEED TO KNOW ABOUT THE DASH EATING PLAN?  For the DASH eating plan, you will follow these general guidelines:  · Choose foods with a percent daily value for sodium of less than 5% (as listed on the food label).  · Use salt-free seasonings or herbs instead of table salt or sea salt.  · Check with your health care provider or pharmacist before using salt substitutes.  · Eat lower-sodium products, often labeled as "lower sodium" or "no salt added."  · Eat fresh foods.  · Eat more vegetables, fruits, and low-fat dairy products.  · Choose whole grains. Look for the word "whole" as the first word in the ingredient list.  · Choose fish and skinless chicken or turkey more often than red meat. Limit fish, poultry, and meat to 6 oz (170 g) each day.  · Limit sweets, desserts, sugars, and sugary drinks.  · Choose heart-healthy fats.  · Limit cheese to 1 oz (28 g) per day.  · Eat more home-cooked food and less restaurant, buffet, and fast food.  · Limit fried foods.  · Cook foods using methods other than frying.  · Limit canned vegetables. If you do use them, rinse them well to decrease the sodium.  · When eating at a restaurant, ask that your food be prepared with less salt, or no salt if possible.  WHAT FOODS CAN I EAT?  Seek help from a dietitian for individual calorie needs.  Grains  Whole grain or whole wheat bread. Brown rice. Whole grain or whole wheat pasta. Quinoa, bulgur, and whole grain cereals. Low-sodium cereals. Corn or whole wheat flour tortillas. Whole grain cornbread. Whole grain crackers. Low-sodium crackers.  Vegetables  Fresh or frozen vegetables  (raw, steamed, roasted, or grilled). Low-sodium or reduced-sodium tomato and vegetable juices. Low-sodium or reduced-sodium tomato sauce and paste. Low-sodium or reduced-sodium canned vegetables.   Fruits  All fresh, canned (in natural juice), or frozen fruits.  Meat and Other Protein Products  Ground beef (85% or leaner), grass-fed beef, or beef trimmed of fat. Skinless chicken or turkey. Ground chicken or turkey. Pork trimmed of fat. All fish and seafood. Eggs. Dried beans, peas, or lentils. Unsalted nuts and seeds. Unsalted canned beans.  Dairy  Low-fat dairy products, such as skim or 1% milk, 2% or reduced-fat cheeses, low-fat ricotta or cottage cheese, or plain low-fat yogurt. Low-sodium or reduced-sodium cheeses.  Fats and Oils  Tub margarines without trans fats. Light or reduced-fat mayonnaise and salad dressings (reduced sodium). Avocado. Safflower, olive, or canola oils. Natural peanut or almond butter.  Other  Unsalted popcorn and pretzels.  The items listed above may not be a complete list of recommended foods or beverages. Contact your dietitian for more options.  WHAT FOODS ARE NOT RECOMMENDED?  Grains  White bread. White pasta. White rice. Refined cornbread. Bagels and croissants. Crackers that contain trans fat.  Vegetables  Creamed or fried vegetables. Vegetables in a cheese sauce. Regular canned vegetables. Regular canned tomato sauce and paste. Regular tomato and vegetable juices.  Fruits  Dried fruits. Canned fruit in light or heavy syrup. Fruit juice.  Meat and Other Protein   Products  Fatty cuts of meat. Ribs, chicken wings, bacon, sausage, bologna, salami, chitterlings, fatback, hot dogs, bratwurst, and packaged luncheon meats. Salted nuts and seeds. Canned beans with salt.  Dairy  Whole or 2% milk, cream, half-and-half, and cream cheese. Whole-fat or sweetened yogurt. Full-fat cheeses or blue cheese. Nondairy creamers and whipped toppings. Processed cheese, cheese spreads, or cheese  curds.  Condiments  Onion and garlic salt, seasoned salt, table salt, and sea salt. Canned and packaged gravies. Worcestershire sauce. Tartar sauce. Barbecue sauce. Teriyaki sauce. Soy sauce, including reduced sodium. Steak sauce. Fish sauce. Oyster sauce. Cocktail sauce. Horseradish. Ketchup and mustard. Meat flavorings and tenderizers. Bouillon cubes. Hot sauce. Tabasco sauce. Marinades. Taco seasonings. Relishes.  Fats and Oils  Butter, stick margarine, lard, shortening, ghee, and bacon fat. Coconut, palm kernel, or palm oils. Regular salad dressings.  Other  Pickles and olives. Salted popcorn and pretzels.  The items listed above may not be a complete list of foods and beverages to avoid. Contact your dietitian for more information.  WHERE CAN I FIND MORE INFORMATION?  National Heart, [employer], [employer]: www.nhlbi.nih.gov/health/health-topics/topics/dash/     This information is not intended to replace advice given to you by your health care provider. Make sure you discuss any questions you have with your health care provider.     Document Released: 11/29/2011 Document Revised: 12/31/2014 Document Reviewed: 10/14/2013  Elsevier Interactive Patient Education ©2016 [employer]

## 2016-02-16 NOTE — Progress Notes (Signed)
    Subjective:    Patient ID: Sandra Pittman is a 35 y.o. female presenting with Hospitalization Follow-up  on 02/16/2016  HPI: Sandra Pittman to ED wit significant headache. Had restarted HCTZ at 25 mg and changed to NuvaRing. On Effexor and advised to stop it. BP in ED 180/90's. Added Vasotec. Taking this, but still with headache. Also diagnosed with Trich in the ED.  Review of Systems  Constitutional: Negative for fever and chills.  Respiratory: Negative for shortness of breath.   Cardiovascular: Negative for chest pain.  Gastrointestinal: Negative for nausea, vomiting and abdominal pain.  Genitourinary: Negative for dysuria.  Skin: Negative for rash.  Neurological: Positive for headaches.      Objective:    BP 155/106 mmHg  Pulse 93  Temp(Src) 98.3 F (36.8 C) (Oral)  Ht  (1.575 m)  Wt 186 lb (84.369 kg)  BMI 34.01 kg/m2  LMP 01/23/2016 (Approximate) Physical Exam  Constitutional: She is oriented to person, place, and time. She appears well-developed and well-nourished. No distress.  HENT:  Head: Normocephalic and atraumatic.  Eyes: No scleral icterus.  Neck: Neck supple.  Cardiovascular: Normal rate.   Pulmonary/Chest: Effort normal.  Abdominal: Soft.  Neurological: She is alert and oriented to person, place, and time.  Skin: Skin is warm and dry.  Psychiatric: She has a normal mood and affect.        Assessment & Plan:   Problem List Items Addressed This Visit      High   HYPERTENSION - Primary    Stop NuvaRing. Increase Vasotec to 20 mg daily. Stop Effexor. See if this improves her BP.      Relevant Medications   enalapril (VASOTEC) 20 MG tablet     Medium   Major depressive disorder, recurrent episode (HCC)    Change to zoloft. Begin at 51 and taper up prn      Relevant Medications   sertraline (ZOLOFT) 50 MG tablet    Other Visit Diagnoses    Acute nonintractable headache, unspecified headache type        Likely related to BP--trial of flexeril  and changes as outlined to decrease her BP.    Relevant Medications    cyclobenzaprine (FLEXERIL) 10 MG tablet    sertraline (ZOLOFT) 50 MG tablet        Total face-to-face time with patient: 25 minutes. Over 50% of encounter was spent on counseling and coordination of care. Return in about 4 weeks (around 03/15/2016) for a follow-up.  Sandra Pittman S 02/16/2016 3:12 PM

## 2016-02-16 NOTE — Assessment & Plan Note (Addendum)
Stop NuvaRing. Increase Vasotec to 20 mg daily. Stop Effexor. See if this improves her BP.

## 2016-03-07 ENCOUNTER — Ambulatory Visit: Payer: BLUE CROSS/BLUE SHIELD | Admitting: Family Medicine

## 2016-04-05 ENCOUNTER — Ambulatory Visit (INDEPENDENT_AMBULATORY_CARE_PROVIDER_SITE_OTHER): Payer: BLUE CROSS/BLUE SHIELD | Admitting: Family Medicine

## 2016-04-05 ENCOUNTER — Encounter: Payer: Self-pay | Admitting: Family Medicine

## 2016-04-05 VITALS — BP 127/77 | HR 96 | Temp 98.3°F | Wt 184.7 lb

## 2016-04-05 DIAGNOSIS — Z308 Encounter for other contraceptive management: Secondary | ICD-10-CM | POA: Diagnosis not present

## 2016-04-05 DIAGNOSIS — I10 Essential (primary) hypertension: Secondary | ICD-10-CM

## 2016-04-05 DIAGNOSIS — F332 Major depressive disorder, recurrent severe without psychotic features: Secondary | ICD-10-CM | POA: Diagnosis not present

## 2016-04-05 DIAGNOSIS — Z3040 Encounter for surveillance of contraceptives, unspecified: Secondary | ICD-10-CM

## 2016-04-05 DIAGNOSIS — Z30017 Encounter for initial prescription of implantable subdermal contraceptive: Secondary | ICD-10-CM | POA: Diagnosis not present

## 2016-04-05 DIAGNOSIS — Z30019 Encounter for initial prescription of contraceptives, unspecified: Secondary | ICD-10-CM

## 2016-04-05 LAB — POCT URINE PREGNANCY: Preg Test, Ur: NEGATIVE

## 2016-04-05 MED ORDER — SERTRALINE HCL 100 MG PO TABS: 100.0000 mg | ORAL_TABLET | Freq: Every day | ORAL | Status: DC

## 2016-04-05 MED ORDER — ENALAPRIL MALEATE 20 MG PO TABS: 20.0000 mg | ORAL_TABLET | Freq: Every day | ORAL | Status: DC

## 2016-04-05 NOTE — Patient Instructions (Signed)
Major Depressive Disorder Major depressive disorder is a mental illness. It also may be called clinical depression or unipolar depression. Major depressive disorder usually causes feelings of sadness, hopelessness, or helplessness. Some people with this disorder do not feel particularly sad but lose interest in doing things they used to enjoy (anhedonia). Major depressive disorder also can cause physical symptoms. It can interfere with work, school, relationships, and other normal everyday activities. The disorder varies in severity but is longer lasting and more serious than the sadness we all feel from time to time in our lives. Major depressive disorder often is triggered by stressful life events or major life changes. Examples of these triggers include divorce, loss of your job or home, a move, and the death of a family member or close friend. Sometimes this disorder occurs for no obvious reason at all. People who have family members with major depressive disorder or bipolar disorder are at higher risk for developing this disorder, with or without life stressors. Major depressive disorder can occur at any age. It may occur just once in your life (single episode major depressive disorder). It may occur multiple times (recurrent major depressive disorder). SYMPTOMS People with major depressive disorder have either anhedonia or depressed mood on nearly a daily basis for at least 2 weeks or longer. Symptoms of depressed mood include:  Feelings of sadness (blue or down in the dumps) or emptiness.  Feelings of hopelessness or helplessness.  Tearfulness or episodes of crying (may be observed by others).  Irritability (children and adolescents). In addition to depressed mood or anhedonia or both, people with this disorder have at least four of the following symptoms:  Difficulty sleeping or sleeping too much.   Significant change (increase or decrease) in appetite or weight.   Lack of energy or  motivation.  Feelings of guilt and worthlessness.   Difficulty concentrating, remembering, or making decisions.  Unusually slow movement (psychomotor retardation) or restlessness (as observed by others).   Recurrent wishes for death, recurrent thoughts of self-harm (suicide), or a suicide attempt. People with major depressive disorder commonly have persistent negative thoughts about themselves, other people, and the world. People with severe major depressive disorder may experiencedistorted beliefs or perceptions about the world (psychotic delusions). They also may see or hear things that are not real (psychotic hallucinations). DIAGNOSIS Major depressive disorder is diagnosed through an assessment by your health care provider. Your health care provider will ask aboutaspects of your daily life, such as mood,sleep, and appetite, to see if you have the diagnostic symptoms of major depressive disorder. Your health care provider may ask about your medical history and use of alcohol or drugs, including prescription medicines. Your health care provider also may do a physical exam and blood work. This is because certain medical conditions and the use of certain substances can cause major depressive disorder-like symptoms (secondary depression). Your health care provider also may refer you to a mental health specialist for further evaluation and treatment. TREATMENT It is important to recognize the symptoms of major depressive disorder and seek treatment. The following treatments can be prescribed for this disorder:   Medicine. Antidepressant medicines usually are prescribed. Antidepressant medicines are thought to correct chemical imbalances in the brain that are commonly associated with major depressive disorder. Other types of medicine may be added if the symptoms do not respond to antidepressant medicines alone or if psychotic delusions or hallucinations occur.  Talk therapy. Talk therapy can be  helpful in treating major depressive disorder by providing   support, education, and guidance. Certain types of talk therapy also can help with negative thinking (cognitive behavioral therapy) and with relationship issues that trigger this disorder (interpersonal therapy). A mental health specialist can help determine which treatment is best for you. Most people with major depressive disorder do well with a combination of medicine and talk therapy. Treatments involving electrical stimulation of the brain can be used in situations with extremely severe symptoms or when medicine and talk therapy do not work over time. These treatments include electroconvulsive therapy, transcranial magnetic stimulation, and vagal nerve stimulation.   This information is not intended to replace advice given to you by your health care provider. Make sure you discuss any questions you have with your health care provider.   Document Released: 04/06/2013 Document Revised: 12/31/2014 Document Reviewed: 04/06/2013 Elsevier Interactive Patient Education 2016 [employer]. Etonogestrel implant What is this medicine? ETONOGESTREL (et oh noe JES trel) is a contraceptive (birth control) device. It is used to prevent pregnancy. It can be used for up to 3 years. This medicine may be used for other purposes; ask your health care provider or pharmacist if you have questions. What should I tell my health care provider before I take this medicine? They need to know if you have any of these conditions: -abnormal vaginal bleeding -blood vessel disease or blood clots -cancer of the breast, cervix, or liver -depression -diabetes -gallbladder disease -headaches -heart disease or recent heart attack -high blood pressure -high cholesterol -kidney disease -liver disease -renal disease -seizures -tobacco smoker -an unusual or allergic reaction to etonogestrel, other hormones, anesthetics or antiseptics, medicines, foods, dyes, or  preservatives -pregnant or trying to get pregnant -breast-feeding How should I use this medicine? This device is inserted just under the skin on the inner side of your upper arm by a health care professional. Talk to your pediatrician regarding the use of this medicine in children. Special care may be needed. Overdosage: If you think you have taken too much of this medicine contact a poison control center or emergency room at once. NOTE: This medicine is only for you. Do not share this medicine with others. What if I miss a dose? This does not apply. What may interact with this medicine? Do not take this medicine with any of the following medications: -amprenavir -bosentan -fosamprenavir This medicine may also interact with the following medications: -barbiturate medicines for inducing sleep or treating seizures -certain medicines for fungal infections like ketoconazole and itraconazole -griseofulvin -medicines to treat seizures like carbamazepine, felbamate, oxcarbazepine, phenytoin, topiramate -modafinil -phenylbutazone -rifampin -some medicines to treat HIV infection like atazanavir, indinavir, lopinavir, nelfinavir, tipranavir, ritonavir -St. John's wort This list may not describe all possible interactions. Give your health care provider a list of all the medicines, herbs, non-prescription drugs, or dietary supplements you use. Also tell them if you smoke, drink alcohol, or use illegal drugs. Some items may interact with your medicine. What should I watch for while using this medicine? This product does not protect you against HIV infection (AIDS) or other sexually transmitted diseases. You should be able to feel the implant by pressing your fingertips over the skin where it was inserted. Contact your doctor if you cannot feel the implant, and use a non-hormonal birth control method (such as condoms) until your doctor confirms that the implant is in place. If you feel that the implant  may have broken or become bent while in your arm, contact your healthcare provider. What side effects may I notice from receiving  this medicine? Side effects that you should report to your doctor or health care professional as soon as possible: -allergic reactions like skin rash, itching or hives, swelling of the face, lips, or tongue -breast lumps -changes in emotions or moods -depressed mood -heavy or prolonged menstrual bleeding -pain, irritation, swelling, or bruising at the insertion site -scar at site of insertion -signs of infection at the insertion site such as fever, and skin redness, pain or discharge -signs of pregnancy -signs and symptoms of a blood clot such as breathing problems; changes in vision; chest pain; severe, sudden headache; pain, swelling, warmth in the leg; trouble speaking; sudden numbness or weakness of the face, arm or leg -signs and symptoms of liver injury like dark yellow or brown urine; general ill feeling or flu-like symptoms; light-colored stools; loss of appetite; nausea; right upper belly pain; unusually weak or tired; yellowing of the eyes or skin -unusual vaginal bleeding, discharge -signs and symptoms of a stroke like changes in vision; confusion; trouble speaking or understanding; severe headaches; sudden numbness or weakness of the face, arm or leg; trouble walking; dizziness; loss of balance or coordination Side effects that usually do not require medical attention (Report these to your doctor or health care professional if they continue or are bothersome.): -acne -back pain -breast pain -changes in weight -dizziness -general ill feeling or flu-like symptoms -headache -irregular menstrual bleeding -nausea -sore throat -vaginal irritation or inflammation This list may not describe all possible side effects. Call your doctor for medical advice about side effects. You may report side effects to FDA at 1-800-FDA-1088. Where should I keep my  medicine? This drug is given in a hospital or clinic and will not be stored at home. NOTE: This sheet is a summary. It may not cover all possible information. If you have questions about this medicine, talk to your doctor, pharmacist, or health care provider.    2016, Elsevier/Gold Standard. (2014-09-24 14:07:06)

## 2016-04-05 NOTE — Assessment & Plan Note (Signed)
For Nexplanon insertion today

## 2016-04-05 NOTE — Progress Notes (Signed)
Verbal consent received and witnessed by both provider and Jaydence Arnesen,CMA.

## 2016-04-05 NOTE — Progress Notes (Signed)
    Subjective:    Patient ID: Sandra Pittman is a 35 y.o. female presenting with Follow-up  on 04/05/2016  HPI: Here today for f/u. Stopped NuvaRing and added Vasotec and BP is well controlled.   Zoloft is ok, but still with feelings of depression, poor concentration, crying spells and poor sleep. PHQ9 score is 9. Desires contraception. On cycle now, no sex since started.   Review of Systems  Constitutional: Negative for fever and chills.  Respiratory: Negative for shortness of breath.   Cardiovascular: Negative for chest pain.  Gastrointestinal: Negative for nausea, vomiting and abdominal pain.  Genitourinary: Positive for vaginal bleeding. Negative for dysuria. Decreased urine volume: on menses.  Skin: Negative for rash.  Psychiatric/Behavioral: Positive for sleep disturbance, dysphoric mood and decreased concentration. Negative for suicidal ideas, hallucinations and self-injury.      Objective:    BP 127/77 mmHg  Pulse 96  Temp(Src) 98.3 F (36.8 C) (Oral)  Wt 184 lb 11.2 oz (83.779 kg)  LMP 04/01/2016 (Exact Date) Physical Exam  Constitutional: She is oriented to person, place, and time. She appears well-developed and well-nourished. No distress.  HENT:  Head: Normocephalic and atraumatic.  Eyes: No scleral icterus.  Neck: Neck supple.  Cardiovascular: Normal rate.   Pulmonary/Chest: Effort normal.  Abdominal: Soft.  Neurological: She is alert and oriented to person, place, and time.  Skin: Skin is warm and dry.  Psychiatric: She has a normal mood and affect.   Procedure: Patient given informed consent, signed copy in the chart, time out was performed. Pregnancy test was neg. Appropriate time out taken.  Patient's left arm was prepped and draped in the usual sterile fashion.. The ruler used to measure and mark insertion area.  Pt was prepped with alcohol swab and then injected with 3 cc of 1% lidocaine with epinephrine.  Pt was prepped with betadine, Nexplanon removed  form packaging,  Device confirmed in needle, then inserted full length of needle and withdrawn per handbook instructions.  Pt insertion site covered with steri-strips and pressure.   Minimal blood loss.  Pt tolerated the procedure well.       Assessment & Plan:   Total face-to-face time with patient: 25 minutes. Over 50% of encounter was spent on counseling and coordination of care. Return in about 3 months (around 07/05/2016) for a follow-up.  PRATT,TANYA S 04/05/2016 3:58 PM

## 2016-04-05 NOTE — Assessment & Plan Note (Signed)
Improved control. Continue HCTZ and Vasotec.

## 2016-04-05 NOTE — Assessment & Plan Note (Signed)
Increase Zoloft.

## 2016-04-09 MED ORDER — ETONOGESTREL 68 MG ~~LOC~~ IMPL
68.0000 mg | DRUG_IMPLANT | Freq: Once | SUBCUTANEOUS | Status: AC
  Administered 2016-04-05: 68 mg via SUBCUTANEOUS

## 2016-04-09 NOTE — Addendum Note (Signed)
Addended by: Jone BasemanFLEEGER, Epifanio Labrador D on: 04/09/2016 08:26 AM   Modules accepted: Orders

## 2016-07-10 ENCOUNTER — Encounter: Payer: Self-pay | Admitting: Family Medicine

## 2018-01-02 ENCOUNTER — Ambulatory Visit (INDEPENDENT_AMBULATORY_CARE_PROVIDER_SITE_OTHER): Payer: BLUE CROSS/BLUE SHIELD | Admitting: Family Medicine

## 2018-01-02 ENCOUNTER — Encounter: Payer: Self-pay | Admitting: Family Medicine

## 2018-01-02 ENCOUNTER — Other Ambulatory Visit: Payer: Self-pay

## 2018-01-02 VITALS — BP 124/80 | HR 90 | Temp 98.4°F | Ht 62.0 in | Wt 208.0 lb

## 2018-01-02 DIAGNOSIS — F332 Major depressive disorder, recurrent severe without psychotic features: Secondary | ICD-10-CM | POA: Diagnosis not present

## 2018-01-02 DIAGNOSIS — I10 Essential (primary) hypertension: Secondary | ICD-10-CM

## 2018-01-02 MED ORDER — HYDROCHLOROTHIAZIDE 25 MG PO TABS: 25.0000 mg | ORAL_TABLET | Freq: Every day | ORAL | 3 refills | Status: DC

## 2018-01-02 MED ORDER — SERTRALINE HCL 100 MG PO TABS: 200.0000 mg | ORAL_TABLET | Freq: Every day | ORAL | 3 refills | Status: DC

## 2018-01-02 MED ORDER — ENALAPRIL MALEATE 20 MG PO TABS: 20.0000 mg | ORAL_TABLET | Freq: Every day | ORAL | 2 refills | Status: DC

## 2018-01-02 MED ORDER — DULOXETINE HCL 20 MG PO CPEP: 20.0000 mg | ORAL_CAPSULE | Freq: Every day | ORAL | 3 refills | Status: DC

## 2018-01-02 NOTE — Patient Instructions (Signed)
Persistent Depressive Disorder Persistent depressive disorder (PDD) is a mental health condition. PDD causes symptoms of low-level depression for 2 years or longer. It may also be called long-term (chronic) depression or dysthymia. PDD may include episodes of more severe depression that last for about 2 weeks (major depressive disorder or MDD). PDD can affect the way you think, feel, and sleep. This condition may also affect your relationships. You may be more likely to get sick if you have PDD. Symptoms of PDD occur for most of the day and may include:  Feeling tired (fatigue).  Low energy.  Eating too much or too little.  Sleeping too much or too little.  Feeling restless or agitated.  Feeling hopeless.  Feeling worthless or guilty.  Feeling worried or nervous (anxiety).  Trouble concentrating or making decisions.  Low self-esteem.  A negative way of looking at things (outlook).  Not being able to have fun or feel pleasure.  Avoiding interacting with people.  Getting angry or annoyed easily (irritability).  Acting aggressive or angry.  Follow these instructions at home: Activity  Go back to your normal activities as told by your doctor.  Exercise regularly as told by your doctor. General instructions  Take over-the-counter and prescription medicines only as told by your doctor.  Do not drink alcohol. Or, limit how much alcohol you drink to no more than 1 drink a day for nonpregnant women and 2 drinks a day for men. One drink equals 12 oz of beer, 5 oz of wine, or 1 oz of hard liquor. Alcohol can affect any antidepressant medicines you are taking. Talk with your doctor about your alcohol use.  Eat a healthy diet and get plenty of sleep.  Find activities that you enjoy each day.  Consider joining a support group. Your doctor may be able to suggest a support group.  Keep all follow-up visits as told by your doctor. This is important. Where to find more  information: National Alliance on Mental Illness  www.nami.org  U.S. National Institute of Mental Health  www.nimh.nih.gov  National Suicide Prevention Lifeline  1-800-273-TALK (1-800-273-8255). This is free, 24-hour help.  Contact a doctor if:  Your symptoms get worse.  You have new symptoms.  You have trouble sleeping or doing your daily activities. Get help right away if:  You self-harm.  You have serious thoughts about hurting yourself or others.  You see, hear, taste, smell, or feel things that are not there (hallucinate). This information is not intended to replace advice given to you by your health care provider. Make sure you discuss any questions you have with your health care provider. Document Released: 11/21/2015 Document Revised: 08/03/2016 Document Reviewed: 08/03/2016 Elsevier Interactive Patient Education  2017 Elsevier Inc.  

## 2018-01-02 NOTE — Assessment & Plan Note (Signed)
Has self increased her zoloft to 200mg  daily--has significant anxiety component as well. Add Cymbalta. Mindfulness training

## 2018-01-02 NOTE — Progress Notes (Signed)
   Subjective:    Patient ID: Sandra Pittman is a 37 y.o. female presenting with Annual Exam  on 01/02/2018  HPI: [place] to [place]. She is feeling very depressed. Her father is in the hospital with a heart attack.  Review of Systems  Constitutional: Negative for chills and fever.  Respiratory: Negative for shortness of breath.   Cardiovascular: Negative for chest pain.  Gastrointestinal: Negative for abdominal pain, nausea and vomiting.  Genitourinary: Negative for dysuria.  Skin: Negative for rash.      Objective:    BP 124/80   Pulse 90   Temp 98.4 F (36.9 C) (Oral)   Ht 5\' 2"  (1.575 m)   Wt 208 lb (94.3 kg)   SpO2 100%   BMI 38.04 kg/m  Physical Exam  Constitutional: She is oriented to person, place, and time. She appears well-developed and well-nourished. No distress.  HENT:  Head: Normocephalic and atraumatic.  Eyes: No scleral icterus.  Neck: Neck supple.  Cardiovascular: Normal rate.  Pulmonary/Chest: Effort normal.  Abdominal: Soft.  Neurological: She is alert and oriented to person, place, and time.  Skin: Skin is warm and dry.  Psychiatric: She has a normal mood and affect.        Assessment & Plan:   Problem List Items Addressed This Visit      High   HYPERTENSION - Primary    Continue lisinopril and HCTZ--BP is at goal      Relevant Medications   enalapril (VASOTEC) 20 MG tablet   hydrochlorothiazide (HYDRODIURIL) 25 MG tablet     Medium   Major depressive disorder, recurrent episode (HCC)    Has self increased her zoloft to 200mg  daily--has significant anxiety component as well. Add Cymbalta. Mindfulness training      Relevant Medications   DULoxetine (CYMBALTA) 20 MG capsule   sertraline (ZOLOFT) 100 MG tablet      Total face-to-face time with patient: 15 minutes. Over 50% of encounter was spent on counseling and coordination of care. No Follow-up on file.  Sandra Pittman 01/02/2018 2:36 PM

## 2018-01-02 NOTE — Assessment & Plan Note (Signed)
Continue lisinopril and HCTZ--BP is at goal

## 2019-03-25 DIAGNOSIS — I10 Essential (primary) hypertension: Secondary | ICD-10-CM

## 2019-03-25 DIAGNOSIS — F332 Major depressive disorder, recurrent severe without psychotic features: Secondary | ICD-10-CM

## 2019-03-25 MED ORDER — DULOXETINE HCL 20 MG PO CPEP: 20.0000 mg | ORAL_CAPSULE | Freq: Every day | ORAL | 3 refills | Status: AC

## 2019-03-25 MED ORDER — SERTRALINE HCL 100 MG PO TABS: 200.0000 mg | ORAL_TABLET | Freq: Every day | ORAL | 3 refills | Status: AC

## 2019-03-25 MED ORDER — HYDROCHLOROTHIAZIDE 25 MG PO TABS: 25.0000 mg | ORAL_TABLET | Freq: Every day | ORAL | 3 refills | Status: AC

## 2019-03-25 MED ORDER — ENALAPRIL MALEATE 20 MG PO TABS: 20.0000 mg | ORAL_TABLET | Freq: Every day | ORAL | 2 refills | Status: AC
